# Patient Record
Sex: Female | Born: 1985 | Race: White | Hispanic: No | Marital: Married | Smoking: Never smoker
Health system: Southern US, Community
[De-identification: ages and names within clinical notes are randomized; demographics above are authoritative.]

## PROBLEM LIST (undated history)

## (undated) DIAGNOSIS — D649 Anemia, unspecified: Secondary | ICD-10-CM

## (undated) HISTORY — PX: OTHER SURGICAL HISTORY: SHX169

---

## 2005-10-14 HISTORY — PX: FOOT SURGERY: SHX648

## 2017-02-05 ENCOUNTER — Encounter: Payer: Self-pay | Admitting: Family Medicine

## 2017-02-05 LAB — HM PAP SMEAR: HM PAP: NEGATIVE

## 2017-10-27 DIAGNOSIS — N925 Other specified irregular menstruation: Secondary | ICD-10-CM | POA: Diagnosis not present

## 2017-10-27 DIAGNOSIS — Z113 Encounter for screening for infections with a predominantly sexual mode of transmission: Secondary | ICD-10-CM | POA: Diagnosis not present

## 2017-10-27 DIAGNOSIS — Z6821 Body mass index (BMI) 21.0-21.9, adult: Secondary | ICD-10-CM | POA: Diagnosis not present

## 2017-11-07 DIAGNOSIS — Z3490 Encounter for supervision of normal pregnancy, unspecified, unspecified trimester: Secondary | ICD-10-CM | POA: Insufficient documentation

## 2017-11-11 ENCOUNTER — Encounter: Payer: Self-pay | Admitting: Family Medicine

## 2017-11-11 DIAGNOSIS — Z23 Encounter for immunization: Secondary | ICD-10-CM | POA: Diagnosis not present

## 2017-11-11 DIAGNOSIS — Z3491 Encounter for supervision of normal pregnancy, unspecified, first trimester: Secondary | ICD-10-CM | POA: Diagnosis not present

## 2017-11-24 ENCOUNTER — Encounter: Payer: Self-pay | Admitting: Family Medicine

## 2017-11-24 DIAGNOSIS — Z3480 Encounter for supervision of other normal pregnancy, unspecified trimester: Secondary | ICD-10-CM | POA: Diagnosis not present

## 2017-11-24 DIAGNOSIS — Z3481 Encounter for supervision of other normal pregnancy, first trimester: Secondary | ICD-10-CM | POA: Diagnosis not present

## 2017-11-24 LAB — OB RESULTS CONSOLE RPR: RPR: NONREACTIVE

## 2017-11-24 LAB — OB RESULTS CONSOLE HIV ANTIBODY (ROUTINE TESTING): HIV: NONREACTIVE

## 2017-11-24 LAB — OB RESULTS CONSOLE HEPATITIS B SURFACE ANTIGEN: Hepatitis B Surface Ag: NEGATIVE

## 2017-11-24 LAB — OB RESULTS CONSOLE RUBELLA ANTIBODY, IGM: Rubella: IMMUNE

## 2017-11-24 LAB — OB RESULTS CONSOLE ANTIBODY SCREEN: Antibody Screen: NEGATIVE

## 2017-11-24 LAB — OB RESULTS CONSOLE GC/CHLAMYDIA
Chlamydia: NEGATIVE
GC PROBE AMP, GENITAL: NEGATIVE

## 2017-11-24 LAB — OB RESULTS CONSOLE ABO/RH: RH Type: POSITIVE

## 2017-11-25 LAB — CBC AND DIFFERENTIAL
HCT: 35 — AB (ref 36–46)
HEMOGLOBIN: 11 — AB (ref 12.0–16.0)
Platelets: 300 (ref 150–399)

## 2017-11-30 ENCOUNTER — Encounter: Payer: Self-pay | Admitting: Family Medicine

## 2017-12-12 DIAGNOSIS — B379 Candidiasis, unspecified: Secondary | ICD-10-CM | POA: Diagnosis not present

## 2017-12-12 DIAGNOSIS — B3789 Other sites of candidiasis: Secondary | ICD-10-CM | POA: Diagnosis not present

## 2017-12-13 ENCOUNTER — Encounter (HOSPITAL_COMMUNITY): Payer: Self-pay | Admitting: *Deleted

## 2017-12-13 ENCOUNTER — Ambulatory Visit (HOSPITAL_COMMUNITY)
Admission: EM | Admit: 2017-12-13 | Discharge: 2017-12-13 | Disposition: A | Discharge status: Home / Self Care | Payer: BLUE CROSS/BLUE SHIELD | Attending: Family Medicine | Admitting: Family Medicine

## 2017-12-13 ENCOUNTER — Other Ambulatory Visit: Payer: Self-pay

## 2017-12-13 DIAGNOSIS — B372 Candidiasis of skin and nail: Secondary | ICD-10-CM

## 2017-12-13 MED ORDER — CLOTRIMAZOLE-BETAMETHASONE 1-0.05 % EX CREA: TOPICAL_CREAM | CUTANEOUS | 0 refills | Status: DC

## 2017-12-13 NOTE — ED Triage Notes (Addendum)
Rash under both arms, pelvic area, bottom, per pt it itch, burns, and swollen, started Thursday morning, per pt she wen to a walk in clinic and was prescribed Miconazole and she thinks it made it worse

## 2017-12-18 NOTE — ED Provider Notes (Signed)
  [facility]   [id number] 12/13/17 Arrival Time: 1526  ASSESSMENT & PLAN:  1. Yeast dermatitis     Meds ordered this encounter  Medications  . clotrimazole-betamethasone (LOTRISONE) cream    Sig: Apply to affected area 2 times daily for up to one week.    Dispense:  45 g    Refill:  0    Will f/u here if not seeing improvement over the next several days. Reviewed expectations re: course of current medical issues. Questions answered. Outlined signs and symptoms indicating need for more acute intervention. Patient verbalized understanding. After Visit Summary given.   SUBJECTIVE:  Terri Donaldson is a 32 y.o. female who presents with a skin complaint.   Location: bilateral axilla; inner buttocks Onset: gradual Duration: several days Pruritic? Yes Painful? Occasional discomfort Drainage? No  Known trigger? No  New soaps/lotions/topicals/detergents? No Environmental exposures or allergies? none Contacts with similar? No Recent travel? No  Other associated symptoms: none Therapies tried thus far: Miconazole cream BID; not much relief Denies abdominal pain, arthralgia and fever   ROS: As per HPI.  OBJECTIVE: Vitals:   12/13/17 1549  BP: 127/76  Pulse: 100  Temp: 98.3 F (36.8 C)  TempSrc: Oral  SpO2: 100%    General appearance: alert; no distress Lungs: clear to auscultation bilaterally Heart: regular rate and rhythm Extremities: no edema Skin: warm and dry; bilateral axilla with diffuse confluent erythematous eruption with some underlying erythema; satellite lesions (reports buttock rash is the exact same) Psychological: alert and cooperative; normal mood and affect  No Known Allergies   Social History   Socioeconomic History  . Marital status: Married    Spouse name: Not on file  . Number of children: Not on file  . Years of education: Not on file  . Highest education level: Not on file  Social Needs  . Financial resource strain: Not  on file  . Food insecurity - worry: Not on file  . Food insecurity - inability: Not on file  . Transportation needs - medical: Not on file  . Transportation needs - non-medical: Not on file  Occupational History  . Not on file  Tobacco Use  . Smoking status: Never Smoker  . Smokeless tobacco: Never Used  Substance and Sexual Activity  . Alcohol use: No    Frequency: Never  . Drug use: No  . Sexual activity: Not on file  Other Topics Concern  . Not on file  Social History Narrative  . Not on file    Past Surgical History:  Procedure Laterality Date  . FOOT SURGERY  2007     Mardella LaymanHagler, Dariela Stoker, MD 12/18/17 [phone]

## 2018-01-28 DIAGNOSIS — Z363 Encounter for antenatal screening for malformations: Secondary | ICD-10-CM | POA: Diagnosis not present

## 2018-03-26 DIAGNOSIS — Z3480 Encounter for supervision of other normal pregnancy, unspecified trimester: Secondary | ICD-10-CM | POA: Diagnosis not present

## 2018-03-26 DIAGNOSIS — Z23 Encounter for immunization: Secondary | ICD-10-CM | POA: Diagnosis not present

## 2018-04-02 DIAGNOSIS — O99810 Abnormal glucose complicating pregnancy: Secondary | ICD-10-CM | POA: Diagnosis not present

## 2018-04-29 DIAGNOSIS — Z369 Encounter for antenatal screening, unspecified: Secondary | ICD-10-CM | POA: Diagnosis not present

## 2018-04-29 DIAGNOSIS — R1011 Right upper quadrant pain: Secondary | ICD-10-CM | POA: Diagnosis not present

## 2018-04-29 DIAGNOSIS — O99019 Anemia complicating pregnancy, unspecified trimester: Secondary | ICD-10-CM | POA: Diagnosis not present

## 2018-05-15 DIAGNOSIS — Z369 Encounter for antenatal screening, unspecified: Secondary | ICD-10-CM | POA: Diagnosis not present

## 2018-05-26 ENCOUNTER — Encounter: Payer: Self-pay | Admitting: Family Medicine

## 2018-05-26 DIAGNOSIS — Z369 Encounter for antenatal screening, unspecified: Secondary | ICD-10-CM | POA: Diagnosis not present

## 2018-05-26 DIAGNOSIS — O26843 Uterine size-date discrepancy, third trimester: Secondary | ICD-10-CM | POA: Diagnosis not present

## 2018-05-26 DIAGNOSIS — Z3480 Encounter for supervision of other normal pregnancy, unspecified trimester: Secondary | ICD-10-CM | POA: Diagnosis not present

## 2018-05-26 LAB — OB RESULTS CONSOLE GBS: STREP GROUP B AG: NEGATIVE

## 2018-06-02 DIAGNOSIS — Z369 Encounter for antenatal screening, unspecified: Secondary | ICD-10-CM | POA: Diagnosis not present

## 2018-06-09 DIAGNOSIS — O26849 Uterine size-date discrepancy, unspecified trimester: Secondary | ICD-10-CM | POA: Diagnosis not present

## 2018-06-16 ENCOUNTER — Inpatient Hospital Stay (HOSPITAL_COMMUNITY)
Admission: RE | Admit: 2018-06-16 | Discharge: 2018-06-16 | Disposition: A | Discharge status: Home / Self Care | Payer: BLUE CROSS/BLUE SHIELD | Source: Ambulatory Visit

## 2018-06-16 ENCOUNTER — Other Ambulatory Visit: Payer: Self-pay | Admitting: Obstetrics and Gynecology

## 2018-06-16 ENCOUNTER — Encounter (HOSPITAL_COMMUNITY): Payer: Self-pay

## 2018-06-16 DIAGNOSIS — O403XX1 Polyhydramnios, third trimester, fetus 1: Secondary | ICD-10-CM | POA: Diagnosis not present

## 2018-06-16 DIAGNOSIS — Z369 Encounter for antenatal screening, unspecified: Secondary | ICD-10-CM | POA: Diagnosis not present

## 2018-06-16 HISTORY — DX: Anemia, unspecified: D64.9

## 2018-06-16 NOTE — Patient Instructions (Addendum)
Terri Donaldson  06/16/2018   Your procedure is scheduled on:  06/17/2018  Enter through the Maternity admissions at 0745 AM.    Call this number if you have problems the morning of surgery:[phone]  Remember:   Do not eat food:(After Midnight) Desps de medianoche.  Do not drink clear liquids: (After Midnight) Desps de medianoche.  Take these medicines the morning of surgery with A SIP OF WATER: none   Do not wear jewelry, make-up or nail polish.  Do not wear lotions, powders, or perfumes. Do not wear deodorant.  Do not shave 48 hours prior to surgery.  Do not bring valuables to the hospital.  [facility] is not   responsible for any belongings or valuables brought to the hospital.  Contacts, dentures or bridgework may not be worn into surgery.  Leave suitcase in the car. After surgery it may be brought to your room.  For patients admitted to the hospital, checkout time is 11:00 AM the day of              discharge.    N/A   Please read over the following fact sheets that you were given:   Surgical Site Infection Prevention

## 2018-06-17 ENCOUNTER — Encounter (HOSPITAL_COMMUNITY): Payer: Self-pay | Admitting: *Deleted

## 2018-06-17 ENCOUNTER — Encounter (HOSPITAL_COMMUNITY)
Admission: AD | Disposition: A | Discharge status: Home / Self Care | Payer: Self-pay | Source: Home / Self Care | Attending: Obstetrics and Gynecology

## 2018-06-17 ENCOUNTER — Inpatient Hospital Stay (HOSPITAL_COMMUNITY)
Admission: AD | Admit: 2018-06-17 | Discharge: 2018-06-20 | DRG: 788 | Disposition: A | Discharge status: Home / Self Care | Payer: BLUE CROSS/BLUE SHIELD | Attending: Obstetrics and Gynecology | Admitting: Obstetrics and Gynecology

## 2018-06-17 ENCOUNTER — Inpatient Hospital Stay (HOSPITAL_COMMUNITY): Payer: BLUE CROSS/BLUE SHIELD | Admitting: Certified Registered Nurse Anesthetist

## 2018-06-17 DIAGNOSIS — O403XX1 Polyhydramnios, third trimester, fetus 1: Secondary | ICD-10-CM | POA: Diagnosis not present

## 2018-06-17 DIAGNOSIS — O9902 Anemia complicating childbirth: Secondary | ICD-10-CM | POA: Diagnosis present

## 2018-06-17 DIAGNOSIS — O3663X0 Maternal care for excessive fetal growth, third trimester, not applicable or unspecified: Secondary | ICD-10-CM | POA: Diagnosis not present

## 2018-06-17 DIAGNOSIS — D649 Anemia, unspecified: Secondary | ICD-10-CM | POA: Diagnosis present

## 2018-06-17 DIAGNOSIS — Z3A39 39 weeks gestation of pregnancy: Secondary | ICD-10-CM | POA: Diagnosis not present

## 2018-06-17 DIAGNOSIS — O403XX0 Polyhydramnios, third trimester, not applicable or unspecified: Secondary | ICD-10-CM | POA: Diagnosis present

## 2018-06-17 LAB — CBC
HCT: 36.7 % (ref 36.0–46.0)
HEMOGLOBIN: 12.4 g/dL (ref 12.0–15.0)
MCH: 29.5 pg (ref 26.0–34.0)
MCHC: 33.8 g/dL (ref 30.0–36.0)
MCV: 87.2 fL (ref 78.0–100.0)
Platelets: 134 10*3/uL — ABNORMAL LOW (ref 150–400)
RBC: 4.21 MIL/uL (ref 3.87–5.11)
RDW: 17.3 % — ABNORMAL HIGH (ref 11.5–15.5)
WBC: 8.9 10*3/uL (ref 4.0–10.5)

## 2018-06-17 LAB — PREPARE RBC (CROSSMATCH)

## 2018-06-17 LAB — ABO/RH: ABO/RH(D): A POS

## 2018-06-17 LAB — RPR: RPR Ser Ql: NONREACTIVE

## 2018-06-17 SURGERY — Surgical Case
Anesthesia: Spinal

## 2018-06-17 MED ORDER — DIPHENHYDRAMINE HCL 25 MG PO CAPS: 25.0000 mg | ORAL_CAPSULE | Freq: Four times a day (QID) | ORAL | Status: DC | PRN

## 2018-06-17 MED ORDER — ACETAMINOPHEN 325 MG PO TABS: 650.0000 mg | ORAL_TABLET | ORAL | Status: DC | PRN

## 2018-06-17 MED ORDER — METHYLERGONOVINE MALEATE 0.2 MG PO TABS: 0.2000 mg | ORAL_TABLET | ORAL | Status: DC | PRN

## 2018-06-17 MED ORDER — PHENYLEPHRINE 8 MG IN D5W 100 ML (0.08MG/ML) PREMIX OPTIME
INJECTION | INTRAVENOUS | Status: DC | PRN
  Administered 2018-06-17: 60 ug/min via INTRAVENOUS

## 2018-06-17 MED ORDER — SIMETHICONE 80 MG PO CHEW: 80.0000 mg | CHEWABLE_TABLET | ORAL | Status: DC | PRN

## 2018-06-17 MED ORDER — PRENATAL MULTIVITAMIN CH
1.0000 | ORAL_TABLET | Freq: Every day | ORAL | Status: DC
  Filled 2018-06-17: qty 1

## 2018-06-17 MED ORDER — MENTHOL 3 MG MT LOZG: 1.0000 | LOZENGE | OROMUCOSAL | Status: DC | PRN

## 2018-06-17 MED ORDER — DEXAMETHASONE SODIUM PHOSPHATE 4 MG/ML IJ SOLN
INTRAMUSCULAR | Status: AC
  Filled 2018-06-17: qty 1

## 2018-06-17 MED ORDER — ONDANSETRON HCL 4 MG/2ML IJ SOLN
INTRAMUSCULAR | Status: AC
  Filled 2018-06-17: qty 2

## 2018-06-17 MED ORDER — OXYTOCIN 40 UNITS IN LACTATED RINGERS INFUSION - SIMPLE MED: 2.5000 [IU]/h | INTRAVENOUS | Status: AC

## 2018-06-17 MED ORDER — PHENYLEPHRINE 40 MCG/ML (10ML) SYRINGE FOR IV PUSH (FOR BLOOD PRESSURE SUPPORT)
PREFILLED_SYRINGE | INTRAVENOUS | Status: AC
  Filled 2018-06-17: qty 10

## 2018-06-17 MED ORDER — KETOROLAC TROMETHAMINE 30 MG/ML IJ SOLN: 30.0000 mg | Freq: Once | INTRAMUSCULAR | Status: DC | PRN

## 2018-06-17 MED ORDER — OXYCODONE HCL 5 MG PO TABS: 5.0000 mg | ORAL_TABLET | Freq: Once | ORAL | Status: DC | PRN

## 2018-06-17 MED ORDER — CEFAZOLIN SODIUM-DEXTROSE 2-4 GM/100ML-% IV SOLN
2.0000 g | Freq: Once | INTRAVENOUS | Status: AC
  Administered 2018-06-17: 2 g via INTRAVENOUS

## 2018-06-17 MED ORDER — MORPHINE SULFATE (PF) 0.5 MG/ML IJ SOLN
INTRAMUSCULAR | Status: AC
  Filled 2018-06-17: qty 10

## 2018-06-17 MED ORDER — SENNOSIDES-DOCUSATE SODIUM 8.6-50 MG PO TABS
2.0000 | ORAL_TABLET | ORAL | Status: DC
  Administered 2018-06-17 – 2018-06-19 (×3): 2 via ORAL
  Filled 2018-06-17 (×3): qty 2

## 2018-06-17 MED ORDER — SIMETHICONE 80 MG PO CHEW
80.0000 mg | CHEWABLE_TABLET | ORAL | Status: DC
  Administered 2018-06-17 – 2018-06-19 (×3): 80 mg via ORAL
  Filled 2018-06-17 (×3): qty 1

## 2018-06-17 MED ORDER — ONDANSETRON HCL 4 MG/2ML IJ SOLN
INTRAMUSCULAR | Status: DC | PRN
  Administered 2018-06-17: 4 mg via INTRAVENOUS

## 2018-06-17 MED ORDER — SODIUM CHLORIDE 0.9 % IR SOLN
Status: DC | PRN
  Administered 2018-06-17: 1

## 2018-06-17 MED ORDER — LACTATED RINGERS IV SOLN
INTRAVENOUS | Status: DC
  Administered 2018-06-17: 22:00:00 via INTRAVENOUS

## 2018-06-17 MED ORDER — FENTANYL CITRATE (PF) 100 MCG/2ML IJ SOLN
INTRAMUSCULAR | Status: AC
  Filled 2018-06-17: qty 2

## 2018-06-17 MED ORDER — TETANUS-DIPHTH-ACELL PERTUSSIS 5-2.5-18.5 LF-MCG/0.5 IM SUSP: 0.5000 mL | Freq: Once | INTRAMUSCULAR | Status: DC

## 2018-06-17 MED ORDER — WITCH HAZEL-GLYCERIN EX PADS: 1.0000 "application " | MEDICATED_PAD | CUTANEOUS | Status: DC | PRN

## 2018-06-17 MED ORDER — FENTANYL CITRATE (PF) 100 MCG/2ML IJ SOLN
INTRAMUSCULAR | Status: DC | PRN
  Administered 2018-06-17: 15 ug via INTRATHECAL

## 2018-06-17 MED ORDER — HYDROMORPHONE HCL 1 MG/ML IJ SOLN: 0.2500 mg | INTRAMUSCULAR | Status: DC | PRN

## 2018-06-17 MED ORDER — LACTATED RINGERS IV SOLN
INTRAVENOUS | Status: DC | PRN
  Administered 2018-06-17: 10:00:00 via INTRAVENOUS

## 2018-06-17 MED ORDER — BUPIVACAINE IN DEXTROSE 0.75-8.25 % IT SOLN
INTRATHECAL | Status: DC | PRN
  Administered 2018-06-17: 1.6 mL via INTRATHECAL

## 2018-06-17 MED ORDER — PHENYLEPHRINE 8 MG IN D5W 100 ML (0.08MG/ML) PREMIX OPTIME
INJECTION | INTRAVENOUS | Status: AC
  Filled 2018-06-17: qty 100

## 2018-06-17 MED ORDER — OXYCODONE HCL 5 MG/5ML PO SOLN: 5.0000 mg | Freq: Once | ORAL | Status: DC | PRN

## 2018-06-17 MED ORDER — METHYLERGONOVINE MALEATE 0.2 MG/ML IJ SOLN: 0.2000 mg | INTRAMUSCULAR | Status: DC | PRN

## 2018-06-17 MED ORDER — IBUPROFEN 600 MG PO TABS
600.0000 mg | ORAL_TABLET | Freq: Four times a day (QID) | ORAL | Status: DC
  Administered 2018-06-17 – 2018-06-20 (×12): 600 mg via ORAL
  Filled 2018-06-17 (×13): qty 1

## 2018-06-17 MED ORDER — OXYCODONE-ACETAMINOPHEN 5-325 MG PO TABS: 1.0000 | ORAL_TABLET | ORAL | Status: DC | PRN

## 2018-06-17 MED ORDER — ZOLPIDEM TARTRATE 5 MG PO TABS: 5.0000 mg | ORAL_TABLET | Freq: Every evening | ORAL | Status: DC | PRN

## 2018-06-17 MED ORDER — MORPHINE SULFATE (PF) 0.5 MG/ML IJ SOLN
INTRAMUSCULAR | Status: DC | PRN
  Administered 2018-06-17: .15 mg via INTRATHECAL

## 2018-06-17 MED ORDER — OXYTOCIN 10 UNIT/ML IJ SOLN
INTRAVENOUS | Status: DC | PRN
  Administered 2018-06-17: 40 [IU] via INTRAVENOUS

## 2018-06-17 MED ORDER — PROMETHAZINE HCL 25 MG/ML IJ SOLN: 6.2500 mg | INTRAMUSCULAR | Status: DC | PRN

## 2018-06-17 MED ORDER — OXYCODONE-ACETAMINOPHEN 5-325 MG PO TABS: 2.0000 | ORAL_TABLET | ORAL | Status: DC | PRN

## 2018-06-17 MED ORDER — COCONUT OIL OIL
1.0000 "application " | TOPICAL_OIL | Status: DC | PRN
  Administered 2018-06-18: 1 via TOPICAL
  Filled 2018-06-17: qty 120

## 2018-06-17 MED ORDER — DIBUCAINE 1 % RE OINT: 1.0000 "application " | TOPICAL_OINTMENT | RECTAL | Status: DC | PRN

## 2018-06-17 MED ORDER — SODIUM CHLORIDE 0.9% IV SOLUTION: Freq: Once | INTRAVENOUS | Status: DC

## 2018-06-17 MED ORDER — OXYTOCIN 10 UNIT/ML IJ SOLN
INTRAMUSCULAR | Status: AC
  Filled 2018-06-17: qty 4

## 2018-06-17 MED ORDER — SIMETHICONE 80 MG PO CHEW
80.0000 mg | CHEWABLE_TABLET | Freq: Three times a day (TID) | ORAL | Status: DC
  Administered 2018-06-17 – 2018-06-20 (×7): 80 mg via ORAL
  Filled 2018-06-17 (×8): qty 1

## 2018-06-17 MED ORDER — DEXAMETHASONE SODIUM PHOSPHATE 4 MG/ML IJ SOLN
INTRAMUSCULAR | Status: DC | PRN
  Administered 2018-06-17: 4 mg via INTRAVENOUS

## 2018-06-17 MED ORDER — LACTATED RINGERS IV SOLN
INTRAVENOUS | Status: DC
  Administered 2018-06-17 (×2): via INTRAVENOUS

## 2018-06-17 SURGICAL SUPPLY — 30 items
CHLORAPREP W/TINT 26ML (MISCELLANEOUS) ×2 IMPLANT
CLAMP CORD UMBIL (MISCELLANEOUS) ×2 IMPLANT
CLOTH BEACON ORANGE TIMEOUT ST (SAFETY) ×2 IMPLANT
DERMABOND ADVANCED (GAUZE/BANDAGES/DRESSINGS) ×1
DERMABOND ADVANCED .7 DNX12 (GAUZE/BANDAGES/DRESSINGS) ×1 IMPLANT
DRSG OPSITE POSTOP 4X10 (GAUZE/BANDAGES/DRESSINGS) ×2 IMPLANT
ELECT REM PT RETURN 9FT ADLT (ELECTROSURGICAL) ×2
ELECTRODE REM PT RTRN 9FT ADLT (ELECTROSURGICAL) ×1 IMPLANT
EXTRACTOR VACUUM M CUP 4 TUBE (SUCTIONS) IMPLANT
GLOVE BIO SURGEON STRL SZ7 (GLOVE) ×2 IMPLANT
GLOVE BIOGEL PI IND STRL 7.0 (GLOVE) ×1 IMPLANT
GLOVE BIOGEL PI INDICATOR 7.0 (GLOVE) ×1
GOWN STRL REUS W/TWL LRG LVL3 (GOWN DISPOSABLE) ×4 IMPLANT
KIT ABG SYR 3ML LUER SLIP (SYRINGE) IMPLANT
NEEDLE HYPO 22GX1.5 SAFETY (NEEDLE) IMPLANT
NEEDLE HYPO 25X5/8 SAFETYGLIDE (NEEDLE) IMPLANT
NS IRRIG 1000ML POUR BTL (IV SOLUTION) ×2 IMPLANT
PACK C SECTION WH (CUSTOM PROCEDURE TRAY) ×2 IMPLANT
PAD OB MATERNITY 4.3X12.25 (PERSONAL CARE ITEMS) ×2 IMPLANT
PENCIL SMOKE EVAC W/HOLSTER (ELECTROSURGICAL) ×2 IMPLANT
RTRCTR C-SECT PINK 25CM LRG (MISCELLANEOUS) ×2 IMPLANT
SUT CHROMIC 1 CTX 36 (SUTURE) ×4 IMPLANT
SUT CHROMIC 2 0 CT 1 (SUTURE) ×2 IMPLANT
SUT PDS AB 0 CTX 60 (SUTURE) ×2 IMPLANT
SUT VIC AB 2-0 CT1 27 (SUTURE) ×1
SUT VIC AB 2-0 CT1 TAPERPNT 27 (SUTURE) ×1 IMPLANT
SUT VIC AB 4-0 KS 27 (SUTURE) IMPLANT
SYR 30ML LL (SYRINGE) IMPLANT
TOWEL OR 17X24 6PK STRL BLUE (TOWEL DISPOSABLE) ×2 IMPLANT
TRAY FOLEY W/BAG SLVR 14FR LF (SET/KITS/TRAYS/PACK) ×2 IMPLANT

## 2018-06-17 NOTE — Anesthesia Postprocedure Evaluation (Signed)
Anesthesia Post Note  Patient: Terri Donaldson  Procedure(s) Performed: CESAREAN SECTION (N/A )     Patient location during evaluation: Mother Baby Anesthesia Type: Spinal Level of consciousness: awake and alert Pain management: pain level controlled Vital Signs Assessment: post-procedure vital signs reviewed and stable Respiratory status: spontaneous breathing, nonlabored ventilation and respiratory function stable Cardiovascular status: stable Postop Assessment: no headache, no backache, spinal receding, patient able to bend at knees, no apparent nausea or vomiting, able to ambulate and adequate PO intake Anesthetic complications: no    Last Vitals:  Vitals:   06/17/18 1240 06/17/18 1345  BP: 136/88 136/77  Pulse: 60 70  Resp: 18 17  Temp: 36.7 C 36.9 C  SpO2: 97% 96%    Last Pain:  Vitals:   06/17/18 1345  TempSrc: Oral  PainSc: 0-No pain   Pain Goal: Patients Stated Pain Goal: 4 (06/17/18 0805)               Laban Emperor

## 2018-06-17 NOTE — H&P (Signed)
Terri Donaldson is a 32 y.o. female presenting for primary cesarean section for suspected macrosomia  32 yo G1P0 @ 39+0 presents for primary cesarean section for suspected macrosomia.Her pregnancy has been complicated by anemia. EFW at 37 weeks was 4300 gm with polyhydramnios with AFI of 35. Pt offered primary c/s vs TOL. Pt elected primary c/s OB History    Gravida  1   Para      Term      Preterm      AB      Living        SAB      TAB      Ectopic      Multiple      Live Births             Past Medical History:  Diagnosis Date  . Anemia    Past Surgical History:  Procedure Laterality Date  . FOOT SURGERY  2007  . foot surgery     Family History: family history includes Hyperlipidemia in her father; Hypertension in her father and maternal grandfather. Social History:  reports that she has never smoked. She has never used smokeless tobacco. She reports that she does not drink alcohol or use drugs.     Maternal Diabetes: No Genetic Screening: Normal Maternal Ultrasounds/Referrals: Normal Fetal Ultrasounds or other Referrals:  None Maternal Substance Abuse:  No Significant Maternal Medications:  None Significant Maternal Lab Results:  None Other Comments:  None  ROS History   Blood pressure 120/81, pulse 85, temperature (!) 97.5 F (36.4 C), temperature source Oral, resp. rate 18, height 5\' 5"  (1.651 m), weight 76.8 kg. Exam Physical Exam  Prenatal labs: ABO, Rh: A/Positive/-- (02/11 0000) Antibody: Negative (02/11 0000) Rubella: Immune (02/11 0000) RPR: Nonreactive (02/11 0000)  HBsAg: Negative (02/11 0000)  HIV: Non-reactive (02/11 0000)  GBS: Negative (08/13 0000)   Assessment/Plan: 1) Admit 2) SCDs 3) Type & cross 4) Ancef OCTOR   Waynard Reeds 06/17/2018, 8:38 AM

## 2018-06-17 NOTE — Anesthesia Preprocedure Evaluation (Signed)
Anesthesia Evaluation  Patient identified by MRN, date of birth, ID band Patient awake    Reviewed: Allergy & Precautions, NPO status , Patient's Chart, lab work & pertinent test results  Airway Mallampati: II  TM Distance: >3 FB Neck ROM: Full    Dental no notable dental hx.    Pulmonary neg pulmonary ROS,    Pulmonary exam normal breath sounds clear to auscultation       Cardiovascular negative cardio ROS Normal cardiovascular exam Rhythm:Regular Rate:Normal     Neuro/Psych negative neurological ROS  negative psych ROS   GI/Hepatic negative GI ROS, Neg liver ROS,   Endo/Other  negative endocrine ROS  Renal/GU negative Renal ROS  negative genitourinary   Musculoskeletal negative musculoskeletal ROS (+)   Abdominal   Peds negative pediatric ROS (+)  Hematology negative hematology ROS (+)   Anesthesia Other Findings   Reproductive/Obstetrics negative OB ROS (+) Pregnancy                             Anesthesia Physical Anesthesia Plan  ASA: II  Anesthesia Plan: Spinal   Post-op Pain Management:    Induction:   PONV Risk Score and Plan: 2 and Treatment may vary due to age or medical condition  Airway Management Planned: Natural Airway  Additional Equipment:   Intra-op Plan:   Post-operative Plan:   Informed Consent: I have reviewed the patients History and Physical, chart, labs and discussed the procedure including the risks, benefits and alternatives for the proposed anesthesia with the patient or authorized representative who has indicated his/her understanding and acceptance.   Dental advisory given  Plan Discussed with: CRNA  Anesthesia Plan Comments:         Anesthesia Quick Evaluation  

## 2018-06-17 NOTE — Anesthesia Procedure Notes (Signed)
Spinal  Patient location during procedure: OB Start time: 06/17/2018 9:34 AM End time: 06/17/2018 9:39 AM Staffing Anesthesiologist: Lowella Curb, MD Performed: anesthesiologist  Preanesthetic Checklist Completed: patient identified, surgical consent, pre-op evaluation, timeout performed, IV checked, risks and benefits discussed and monitors and equipment checked Spinal Block Patient position: sitting Prep: site prepped and draped and DuraPrep Patient monitoring: heart rate, cardiac monitor, continuous pulse ox and blood pressure Approach: midline Location: L3-4 Injection technique: single-shot Needle Needle type: Pencan  Needle gauge: 24 G Needle length: 10 cm Assessment Sensory level: T4

## 2018-06-17 NOTE — Lactation Note (Signed)
This note was copied from a baby's chart. Lactation Consultation Note  Patient Name: Terri Donaldson Date: 06/17/2018 Reason for consult: Initial assessment;Primapara;1st time breastfeeding;Term  9 hours old FT female who is being exclusively BF by his mother, she's a P1. Mom took BF classes here at [facility] and she already knows how to hand express. When reviewing hand expression with mom, noted some big drops of colostrum flowing easily out of both breast. Showed parents how to finger feed baby. Mom has a Spectra S1 DEBP at home.  Noticed that mom has small scabs on both nipples, reviewed treatment for sore nipples. After dad changed baby's diaper, LC took baby STS to mom's left breast in cross cradle position and he was able to latch on with very little difficulty. Had so repositioned a couple of times when the latch became shallow, showed mom how to break the latch.  She's been feeding baby on cross cradle position only. Suggested to try to football hold on the next feeding. Gave mom comfort gels, instructions, cleaning and storage were reviewed. Both parents were very receptive to learning and did everything LC suggested, both are first time parents.  Encouraged mom to feed baby STS 8-12 times/24 hours or sooner if feeding cues are present. Discussed cluster feeding and baby's sleeping cycle. Reviewed BF brochure, BF resources and feeding diary, both parents are aware of LC services and will call PRN.  Maternal Data Formula Feeding for Exclusion: No Has patient been taught Hand Expression?: Yes Does the patient have breastfeeding experience prior to this delivery?: No  Feeding Feeding Type: Breast Fed Length of feed: 14 min  LATCH Score Latch: Grasps breast easily, tongue down, lips flanged, rhythmical sucking.  Audible Swallowing: A few with stimulation  Type of Nipple: Everted at rest and after stimulation  Comfort (Breast/Nipple): Filling, red/small blisters or bruises,  mild/mod discomfort  Hold (Positioning): Assistance needed to correctly position infant at breast and maintain latch.  LATCH Score: 7  Interventions Interventions: Breast feeding basics reviewed;Assisted with latch;Skin to skin;Breast massage;Hand express;Breast compression;Adjust position;Position options;Support pillows  Lactation Tools Discussed/Used Tools: Comfort gels WIC Program: No   Consult Status Consult Status: Follow-up Date: 06/18/18 Follow-up type: In-patient    Aaleigha Bozza Venetia Constable 06/17/2018, 7:32 PM

## 2018-06-17 NOTE — Anesthesia Postprocedure Evaluation (Signed)
Anesthesia Post Note  Patient: Terri Donaldson  Procedure(s) Performed: CESAREAN SECTION (N/A )     Patient location during evaluation: PACU Anesthesia Type: Spinal Level of consciousness: oriented and awake and alert Pain management: pain level controlled Vital Signs Assessment: post-procedure vital signs reviewed and stable Respiratory status: spontaneous breathing and respiratory function stable Cardiovascular status: blood pressure returned to baseline and stable Postop Assessment: no headache, no backache and no apparent nausea or vomiting Anesthetic complications: no    Last Vitals:  Vitals:   06/17/18 1215 06/17/18 1240  BP: 123/66 136/88  Pulse: 65 60  Resp: 20 18  Temp:  36.7 C  SpO2: 98% 97%    Last Pain:  Vitals:   06/17/18 1240  TempSrc: Oral  PainSc: 0-No pain   Pain Goal: Patients Stated Pain Goal: 4 (06/17/18 0805)               Lowella Curb

## 2018-06-17 NOTE — Transfer of Care (Signed)
Immediate Anesthesia Transfer of Care Note  Patient: Terri Donaldson  Procedure(s) Performed: CESAREAN SECTION (N/A )  Patient Location: PACU  Anesthesia Type:Spinal  Level of Consciousness: awake, alert  and oriented  Airway & Oxygen Therapy: Patient Spontanous Breathing and Patient connected to nasal cannula oxygen  Post-op Assessment: Report given to RN and Post -op Vital signs reviewed and stable  Post vital signs: Reviewed and stable  Last Vitals:  Vitals Value Taken Time  BP    Temp    Pulse    Resp    SpO2      Last Pain:  Vitals:   06/17/18 0810  TempSrc: Oral  PainSc:       Patients Stated Pain Goal: 4 (06/17/18 0805)  Complications: No apparent anesthesia complications

## 2018-06-17 NOTE — Addendum Note (Signed)
Addendum  created 06/17/18 1607 by Elgie Congo, CRNA   Sign clinical note

## 2018-06-17 NOTE — Op Note (Signed)
Pre-Operative Diagnosis: 1) 39+0-week intrauterine pregnancy 2) suspected macrosomia 3) polyhydramnios Postoperative Diagnosis:  1) 39+0-week intrauterine pregnancy 2) suspected macrosomia 3) polyhydramnios Procedure: Primary low transverse cesarean section Surgeon: Dr. Waynard Reeds Assistant: None Operative Findings: Vigorous female infant in the vertex presentation with Apgar scores of 9 at 1 minute and 9 at 5 minutes.  Normal-appearing ovaries, tubes, uterus.  Infant weight was not available at the time of this dictation Specimen: Placenta for disposal EBL: Total I/O In: 2300 [I.V.:2300] Out: 1139 [Urine:150; Blood:989]   Procedure:Terri Donaldson is an 32 year old gravida 1 para 0 at 73 weeks and 0 days estimated gestational age who presents for cesarean section.  Estimated fetal weight 2 weeks prior to admission was 4300 g.  Patient was offered a trial of labor versus a primary cesarean section and she elected to proceed with primary cesarean section.  Following the appropriate informed consent the patient was brought to the operating room where spinal anesthesia was administered and found to be adequate. She was placed in the dorsal supine position with a leftward tilt. She was prepped and draped in the normal sterile fashion.  The patient was appropriately identified during a preoperative timeout procedure scalpel was then used to make a Pfannenstiel skin incision which was carried down to the underlying layers of soft tissue to the fascia. The fascia was incised in the midline and the fascial incision was extended laterally with Mayo scissors. The superior aspect of the fascial incision was grasped with Coker clamps x2, tented up and the rectus muscles dissected off sharply with the electrocautery unit area and the same procedure was repeated on the inferior aspect of the fascial incision. The rectus muscles were separated in the midline. The abdominal peritoneum was identified, tented up, entered  sharply, and the incision was extended superiorly and inferiorly with good visualization of the bladder. The Alexis retractor was then deployed. The vesicouterine peritoneum was identified, tented up, entered sharply, and the bladder flap was created digitally. Scalpel was then used to make a low transverse incision on the uterus which was extended laterally with blunt dissection . The fetal vertex was identified, delivered easily through the uterine incision followed by the body. The infant was bulb suctioned on the operative field cried vigorously.  After a 1 minute delay the cord was clamped and cut and the infant was passed to the waiting neonatology team. Placenta was then delivered spontaneously, the uterus was cleared of all clot and debris. The uterine incision was repaired with #1 chromic in running locked fashion followed by a second imbricating layer. Ovaries and tubes were inspected and normal. The Alexis retractor was removed. . The abdominal peritoneum was reapproximated with 2-0 Vicryl in a running fashion, the rectus muscles was reapproximated with 2-0 chromic in a running fashion. The fascia was closed with a looped PDS in a running fashion. The skin was closed with 4-0 vicryl in a subcuticular fashion and Dermabond. All sponge lap and needle counts were correct. Patient tolerated the procedure well and recovered in stable condition following the procedure.

## 2018-06-18 LAB — CBC
HCT: 26.1 % — ABNORMAL LOW (ref 36.0–46.0)
Hemoglobin: 8.8 g/dL — ABNORMAL LOW (ref 12.0–15.0)
MCH: 29.6 pg (ref 26.0–34.0)
MCHC: 33.7 g/dL (ref 30.0–36.0)
MCV: 87.9 fL (ref 78.0–100.0)
PLATELETS: 92 10*3/uL — AB (ref 150–400)
RBC: 2.97 MIL/uL — ABNORMAL LOW (ref 3.87–5.11)
RDW: 17.1 % — ABNORMAL HIGH (ref 11.5–15.5)
WBC: 9.6 10*3/uL (ref 4.0–10.5)

## 2018-06-18 LAB — BIRTH TISSUE RECOVERY COLLECTION (PLACENTA DONATION)

## 2018-06-18 MED ORDER — COMPLETENATE 29-1 MG PO CHEW
1.0000 | CHEWABLE_TABLET | Freq: Every day | ORAL | Status: DC
  Administered 2018-06-18 – 2018-06-20 (×3): 1 via ORAL
  Filled 2018-06-18 (×4): qty 1

## 2018-06-18 NOTE — Progress Notes (Addendum)
  Patient is eating, ambulating, voiding.  Pain control is good.  Vitals:   06/17/18 1600 06/17/18 2008 06/18/18 0025 06/18/18 0500  BP:  126/76 111/63 104/64  Pulse:  65 74 70  Resp:  18 16 14   Temp: 98.4 F (36.9 C) 98.7 F (37.1 C) 98.5 F (36.9 C) 98.3 F (36.8 C)  TempSrc: Oral Oral Oral Oral  SpO2: 97% 95% 96% 98%  Weight:      Height:        lungs:   clear to auscultation cor:    RRR Abdomen:  soft, appropriate tenderness, incisions intact and without erythema or exudate ex:    no cords   Lab Results  Component Value Date   WBC 9.6 06/18/2018   HGB 8.8 (L) 06/18/2018   HCT 26.1 (L) 06/18/2018   MCV 87.9 06/18/2018   PLT 92 (L) 06/18/2018    --/--/A POS (09/04 0819)/RI  A/P    Post operative day 1.  Routine post op and postpartum care.  Expect d/c tomorrow.  Percocet for pain control. Iron.  Parents do not want circumcision.

## 2018-06-19 NOTE — Lactation Note (Signed)
This note was copied from a baby's chart. Lactation Consultation Note  Patient Name: Terri Donaldson Date: 06/19/2018 Reason for consult: Follow-up assessment;Other (Comment)   RN called out for [facility] assistance.  Mom has nipple pain of 7 on left side and 2-3 on right side.  Both nipples have bruising and redness; compression stripes on both.  Mom demonstrates hand expression and latches infant deeply using cross cradle.  Swallows heard with massage and compression.  Infant then switched breasts and latched deeply, sustained latched but mom complained of pain.  When infant came off nipple was pinched. Infant lingual frenulum visualized easily.   LC provided mom with NS#24.  Mom placed nipple shield on and was taught to prefill the shield with EBM.  Curved tip used for this.  Mom states pain went from 7 to 1 or 2 with NS.   Mom encouraged to hand express prior to and after every feed.    Discussed DEBP 4-6 times a day after bf. with NS use.  Discussed plan with MBU RN.    LC also encouraged mom to see OP lactation if soreness continues after going home.   Maternal Data    Feeding Feeding Type: Breast Fed Length of feed: 25 min  LATCH Score Latch: Grasps breast easily, tongue down, lips flanged, rhythmical sucking.  Audible Swallowing: A few with stimulation  Type of Nipple: Everted at rest and after stimulation  Comfort (Breast/Nipple): Filling, red/small blisters or bruises, mild/mod discomfort(left nipple more sore/pain 7, compression bruising)  Hold (Positioning): Assistance needed to correctly position infant at breast and maintain latch.  LATCH Score: 7  Interventions Interventions: Breast feeding basics reviewed;Assisted with latch;Skin to skin;Breast massage;Hand express;Pre-pump if needed;Breast compression;Adjust position;DEBP;Hand pump;Expressed milk;Position options;Support pillows  Lactation Tools Discussed/Used Tools: Pump;Nipple Shields Nipple shield size:  24 Breast pump type: Double-Electric Breast Pump;Manual Date initiated:: 06/19/18   Consult Status Consult Status: Follow-up Date: 06/20/18 Follow-up type: In-patient    Terri Donaldson [facility] 06/19/2018, 11:00 AM

## 2018-06-19 NOTE — Progress Notes (Signed)
POD#2 Pt without complaints. Would like to wait on discharge until tomorrow. Lochia wnl VSSAF IMP/ Stable Plan/ Routine care

## 2018-06-19 NOTE — Lactation Note (Signed)
This note was copied from a baby's chart. Lactation Consultation Note  Patient Name: Terri Donaldson [id number] Date: 06/19/2018 Reason for consult: Mother's request  Mom reports he is fussy at the breast sometimes when he latches/esp on the left breast.  Infant bf on arrival with 24 mm nipple shield/infant sleepy. Parents report he has been feeding a while.  Urged mom to hand express prior to using nipple shield and hold breast while using nipple shield . when he is fussy/comes off and on can make it move.  Put some milk on outside of nipple shield so he can immediately taste it. Urged mom to call for assistance as needed,  Mom had wanted assistance earlier.  RN assisted her. Maternal Data    Feeding Feeding Type: Breast Fed Length of feed: 30 min(changing back and forth)  LATCH Score Latch: Grasps breast easily, tongue down, lips flanged, rhythmical sucking.  Audible Swallowing: Spontaneous and intermittent  Type of Nipple: Everted at rest and after stimulation  Comfort (Breast/Nipple): Filling, red/small blisters or bruises, mild/mod discomfort  Hold (Positioning): Assistance needed to correctly position infant at breast and maintain latch.  LATCH Score: 8  Interventions    Lactation Tools Discussed/Used     Consult Status Consult Status: Follow-up Date: 06/20/18 Follow-up type: In-patient    [name] 06/19/2018, 5:46 PM

## 2018-06-20 MED ORDER — IBUPROFEN 600 MG PO TABS: 600.0000 mg | ORAL_TABLET | Freq: Four times a day (QID) | ORAL | 0 refills | Status: DC

## 2018-06-20 MED ORDER — OXYCODONE-ACETAMINOPHEN 5-325 MG PO TABS: 1.0000 | ORAL_TABLET | ORAL | 0 refills | Status: DC | PRN

## 2018-06-20 NOTE — Lactation Note (Signed)
This note was copied from a baby's chart. Lactation Consultation Note  Patient Name: Terri Donaldson [id number] Date: 06/20/2018 Reason for consult: Follow-up assessment;Infant weight loss  Visited with P1 Mom of 71 hr old baby.  Baby at 10.6% weight loss.  Mom breast feeding baby frequently as baby is fussy shortly after he comes off the breast.   Mom had nipple trauma since first latch.  Provided with a nipple shield (24 mm) yesterday.  Mom last pumped last evening at 7pm.   Output- 1 void, 0 stool last 24 hrs.    Assisted Mom to double pump, obtained 4 ml of colostrum  Observed baby on the breast in cross cradle hold using a 24 mm nipple shield.  Baby appears to have a deep latch, no swallowing identified while baby sucking vigorously.  Took nipple shield off and latched baby using SNS with 4 ml colostrum.  Nipple pinched and abraded.  And baby unable to create suction to pull supplement from SNS at breast or on FOB's finger. Only 2 ml taken from SNS  Oral assessment- normal arched palate, short posterior lingual frenulum noted.  Baby unable to create a seal on finger as tongue rocking up and down against finger.    Taught FOB how to pace bottle feed.    Plan- 1- Awaken baby at least every 3 hrs for feeding. 2- Keep baby STS at breast and feed baby using 20 mm nipple shield. 3- Offer baby supplement of EBM+/formula per volume guidelines on handout, increasing each day. 4- Pump both breasts 15-20 mins, using breast massage and hand expression. 5- Follow-up with Lactation, request sent to Clinic. Call prn for concerns.  Parents aware of importance of lactation follow-up, and assessing baby's ability to transfer milk from the breast.  Engorgement prevention and treatment reviewed. Mom aware of OP lactation services.  Encouraged to call prn.  Baby may stay as a Baby Pt., recommended a weight later today prior to possible discharge.  Parents are good with plan  currently.  Interventions Interventions: Breast feeding basics reviewed;Assisted with latch;Skin to skin;Breast massage;Hand express;Breast compression;Adjust position;Support pillows;Position options;Expressed milk;Coconut oil;Comfort gels;DEBP  Lactation Tools Discussed/Used Tools: Pump;Supplemental Nutrition System;Nipple Shields Nipple shield size: 20 Breast pump type: Double-Electric Breast Pump   Consult Status Consult Status: Follow-up Date: 06/23/18 Follow-up type: Out-patient    Judee Clara 06/20/2018, 9:33 AM

## 2018-06-20 NOTE — Progress Notes (Signed)
POD#3 Pt is having difficulty with breastfeeding. She is working with lactation. No post op c/o VSSAF IMP/ Stable Plan/ Will d/c, Baby may have to stay

## 2018-06-20 NOTE — Discharge Summary (Signed)
Obstetric Discharge Summary Reason for Admission: cesarean section Prenatal Procedures: NST and ultrasound Intrapartum Procedures: cesarean: low cervical, transverse Postpartum Procedures: none Complications-Operative and Postpartum: none Hemoglobin  Date Value Ref Range Status  06/18/2018 8.8 (L) 12.0 - 15.0 g/dL Final    Comment:    DELTA CHECK NOTED REPEATED TO VERIFY    HCT  Date Value Ref Range Status  06/18/2018 26.1 (L) 36.0 - 46.0 % Final    Physical Exam:  General: alert and cooperative Lochia: appropriate Uterine Fundus: firm Incision: healing well DVT Evaluation: No evidence of DVT seen on physical exam.  Discharge Diagnoses: Term Pregnancy-delivered and macrosomia and polyhydramnios  Discharge Information: Date: 06/20/2018 Activity: pelvic rest Diet: routine Medications: PNV, Ibuprofen, Iron and Percocet Condition: stable Instructions: refer to practice specific booklet Discharge to: home Follow-up Information    Waynard Reeds, MD. Schedule an appointment as soon as possible for a visit in 1 month(s).   Specialty:  Obstetrics and Gynecology Contact information: [address] [place] [address] [phone]           Newborn Data: Live born female  Birth Weight: 9 lb 14.4 oz (4490 g) APGAR: 9, 9  Newborn Delivery   Birth date/time:  06/17/2018 10:00:00 Delivery type:  C-Section, Low Transverse Trial of labor:  No C-section categorization:  Primary     Home with not known at this time.  ANDERSON,MARK E 06/20/2018, 9:15 AM

## 2018-06-21 LAB — BPAM RBC
BLOOD PRODUCT EXPIRATION DATE: 201910032359
BLOOD PRODUCT EXPIRATION DATE: 201910032359
UNIT TYPE AND RH: 6200
Unit Type and Rh: 6200

## 2018-06-21 LAB — TYPE AND SCREEN
ABO/RH(D): A POS
Antibody Screen: NEGATIVE
UNIT DIVISION: 0
UNIT DIVISION: 0

## 2018-06-27 DIAGNOSIS — Z23 Encounter for immunization: Secondary | ICD-10-CM | POA: Diagnosis not present

## 2018-07-16 ENCOUNTER — Ambulatory Visit: Payer: BLUE CROSS/BLUE SHIELD | Admitting: Family Medicine

## 2018-07-16 ENCOUNTER — Encounter: Payer: Self-pay | Admitting: Family Medicine

## 2018-07-16 VITALS — BP 120/72 | HR 83 | Temp 98.4°F | Ht 64.0 in | Wt 126.6 lb

## 2018-07-16 DIAGNOSIS — Z6820 Body mass index (BMI) 20.0-20.9, adult: Secondary | ICD-10-CM | POA: Diagnosis not present

## 2018-07-16 DIAGNOSIS — B353 Tinea pedis: Secondary | ICD-10-CM

## 2018-07-16 DIAGNOSIS — Z124 Encounter for screening for malignant neoplasm of cervix: Secondary | ICD-10-CM | POA: Diagnosis not present

## 2018-07-16 DIAGNOSIS — B351 Tinea unguium: Secondary | ICD-10-CM | POA: Diagnosis not present

## 2018-07-16 DIAGNOSIS — J302 Other seasonal allergic rhinitis: Secondary | ICD-10-CM

## 2018-07-16 MED ORDER — TRIAMCINOLONE ACETONIDE 0.1 % EX CREA: 1.0000 "application " | TOPICAL_CREAM | Freq: Every day | CUTANEOUS | 1 refills | Status: DC

## 2018-07-16 MED ORDER — CICLOPIROX 8 % EX SOLN: Freq: Every day | CUTANEOUS | 2 refills | Status: DC

## 2018-07-16 MED ORDER — CLOTRIMAZOLE 1 % EX OINT: 1.0000 cm | TOPICAL_OINTMENT | Freq: Two times a day (BID) | CUTANEOUS | 1 refills | Status: DC

## 2018-07-16 NOTE — Progress Notes (Signed)
Terri Donaldson DOB: [birth year] Encounter date: 07/16/2018  This isa 32 y.o. female who presents to establish care. Chief Complaint  Patient presents with  . New Patient (Initial Visit)    spot on the bottom of the left foot, treated with athete foot spray, and little toenail has hardened,     History of present illness: Had son 4 weeks ago; first baby. 9lb 14oz - had C section.   Asthma: exercise induced. Hasn't had inhaler in years. Did fine in pregnancy.   Seasonal allergies/animal allergies. Allergic to most animals except dogs.   Gets screening done at work that includes cholesterol screening.   Anemia during pregnancy; taking iron now. Hb wnl prior to c section. No dizziness, light headedness.    Past Medical History:  Diagnosis Date  . Anemia    Past Surgical History:  Procedure Laterality Date  . CESAREAN SECTION N/A 06/17/2018   Procedure: CESAREAN SECTION;  Surgeon: Waynard Reeds, MD;  Location: [facility] BIRTHING SUITES;  Service: Obstetrics;  Laterality: N/A;  RNFA IF POSSIBLE  . FOOT SURGERY  2007  . foot surgery     No Known Allergies Current Meds  Medication Sig  . ferrous sulfate 325 (65 FE) MG tablet Take 325 mg by mouth daily with breakfast.  . Prenatal Vit-Fe Fumarate-FA (PRENATAL MULTIVITAMIN) TABS tablet Take 1 tablet by mouth daily.    Social History   Tobacco Use  . Smoking status: Never Smoker  . Smokeless tobacco: Never Used  Substance Use Topics  . Alcohol use: No    Frequency: Never   Family History  Problem Relation Age of Onset  . Osteoporosis Mother   . Hypertension Father   . Hypertension Maternal Grandfather   . Stroke Maternal Grandfather 32  . Osteoporosis Maternal Grandmother   . Osteoporosis Maternal Aunt   . Thyroid cancer Maternal Aunt   . Breast cancer Maternal Aunt 45     Review of Systems  Constitutional: Negative for chills, fatigue and fever.  Respiratory: Negative for cough, chest tightness, shortness of breath and  wheezing.   Cardiovascular: Negative for chest pain, palpitations and leg swelling.  Gastrointestinal: Negative for abdominal pain.  Genitourinary: Negative for difficulty urinating.  Psychiatric/Behavioral: Negative for sleep disturbance and suicidal ideas. The patient is not nervous/anxious.     Objective:  BP 120/72 (BP Location: Left Arm, Patient Position: Sitting, Cuff Size: Normal)   Pulse 83   Temp 98.4 F (36.9 C) (Oral)   Ht 5\' 4"  (1.626 m)   Wt 126 lb 9.6 oz (57.4 kg)   SpO2 99%   BMI 21.73 kg/m   Weight: 126 lb 9.6 oz (57.4 kg)   BP Readings from Last 3 Encounters:  07/16/18 120/72  06/20/18 (!) 144/91  12/13/17 127/76   Wt Readings from Last 3 Encounters:  07/16/18 126 lb 9.6 oz (57.4 kg)  06/17/18 169 lb 6.4 oz (76.8 kg)  06/16/18 168 lb (76.2 kg)    Physical Exam  Constitutional: She is oriented to person, place, and time. She appears well-developed and well-nourished. No distress.  HENT:  Mouth/Throat: Oropharynx is clear and moist.  Neck: Neck supple.  Cardiovascular: Normal rate, regular rhythm and normal heart sounds. Exam reveals no friction rub.  No murmur heard. No lower extremity edema  Pulmonary/Chest: Effort normal and breath sounds normal. No respiratory distress. She has no wheezes. She has no rales.  Lymphadenopathy:    She has no cervical adenopathy.  Neurological: She is alert and oriented to person,  place, and time.  Psychiatric: Her behavior is normal. Cognition and memory are normal.    Assessment/Plan: 1. Athlete's foot on left Topical treatment (avoiding orals due to lactation) - Clotrimazole 1 % OINT; Apply 1 cm topically 2 (two) times daily.  Dispense: 30 g; Refill: 1 - triamcinolone cream (KENALOG) 0.1 %; Apply 1 application topically at bedtime.  Dispense: 15 g; Refill: 1  2. Onychomycosis Care of nails discussed. - ciclopirox (PENLAC) 8 % solution; Apply topically at bedtime. Apply over nail/surrounding skin daily.After seven  (7) days,remove with alcohol and continue cycle.  Dispense: 6.6 mL; Refill: 2  Return in about 6 months (around 01/15/2019) for physical exam.  Theodis Shove, MD Record release for obgyn was signed today.

## 2018-07-16 NOTE — Patient Instructions (Signed)
Apply the clotrimazole twice daily. At bedtime; put the triamcinolone on top of the clotrimazole. Use disposable nail files to help get off scaling, thickened nail.

## 2018-07-20 ENCOUNTER — Encounter: Payer: Self-pay | Admitting: Family Medicine

## 2018-07-23 DIAGNOSIS — R87615 Unsatisfactory cytologic smear of cervix: Secondary | ICD-10-CM | POA: Diagnosis not present

## 2018-07-29 ENCOUNTER — Encounter: Payer: Self-pay | Admitting: Family Medicine

## 2018-09-04 ENCOUNTER — Encounter: Payer: Self-pay | Admitting: Family Medicine

## 2018-09-04 ENCOUNTER — Ambulatory Visit (INDEPENDENT_AMBULATORY_CARE_PROVIDER_SITE_OTHER): Payer: BLUE CROSS/BLUE SHIELD | Admitting: Family Medicine

## 2018-09-04 VITALS — BP 116/62 | HR 87 | Temp 97.7°F | Wt 120.4 lb

## 2018-09-04 DIAGNOSIS — B070 Plantar wart: Secondary | ICD-10-CM

## 2018-09-04 DIAGNOSIS — L301 Dyshidrosis [pompholyx]: Secondary | ICD-10-CM | POA: Diagnosis not present

## 2018-09-04 DIAGNOSIS — B351 Tinea unguium: Secondary | ICD-10-CM | POA: Diagnosis not present

## 2018-09-04 NOTE — Progress Notes (Signed)
  Juluis Mireracy Michelle Mooers DOB: 12/04/1985 Encounter date: 09/04/2018  This is a 32 y.o. female who presents with Chief Complaint  Patient presents with  . Follow-up    History of present illness:  Here today for follow up of athletes foot. Has not noted significant improvement in toenail (maybe less thick) or foot. Not itching or irritated. Just thickened.   Also has wart on bottom of left great toe. Wondering about getting it treated.     No Known Allergies Current Meds  Medication Sig  . ciclopirox (PENLAC) 8 % solution Apply topically at bedtime. Apply over nail/surrounding skin daily.After seven (7) days,remove with alcohol and continue cycle.  . Clotrimazole 1 % OINT Apply 1 cm topically 2 (two) times daily.  . ferrous sulfate 325 (65 FE) MG tablet Take 325 mg by mouth daily with breakfast.  . Prenatal Vit-Fe Fumarate-FA (PRENATAL MULTIVITAMIN) TABS tablet Take 1 tablet by mouth daily.   Marland Kitchen. triamcinolone cream (KENALOG) 0.1 % Apply 1 application topically at bedtime.    Review of Systems  Skin:       Has noted "lump" on scalp. Not tender, not bothering her, just there.   See hpi.    Objective:  BP 116/62 (BP Location: Left Arm, Patient Position: Sitting, Cuff Size: Normal)   Pulse 87   Temp 97.7 F (36.5 C) (Oral)   Wt 120 lb 6.4 oz (54.6 kg)   SpO2 98%   BMI 20.67 kg/m   Weight: 120 lb 6.4 oz (54.6 kg)   BP Readings from Last 3 Encounters:  09/04/18 116/62  07/16/18 120/72  06/20/18 (!) 144/91   Wt Readings from Last 3 Encounters:  09/04/18 120 lb 6.4 oz (54.6 kg)  07/16/18 126 lb 9.6 oz (57.4 kg)  06/17/18 169 lb 6.4 oz (76.8 kg)    Physical Exam  Constitutional: She appears well-developed and well-nourished. No distress.  HENT:  Head: Normocephalic and atraumatic.  Pulmonary/Chest: Effort normal.  Skin:  There is mobile 0.5cm cyst anterior scalp.   Scaling has improved on left foot. There is residual callous. There is small plantar wart bottom of  great left toe.   Liquid nitrogen was applied to wart after callous was removed with dermal currette. 3 cycles of nitrogen applied to wart with 1mm ring surrounding affected area. Patient tolerated procedure well.     Assessment/Plan  1. Dyshidrotic eczema Well controlled. Discussed wearing breathable socks and shoes. She does note condition improves in winter when not wearing sandals. Keep callous skin filed down. OK to use steroid cream if worsening. No evidence for fungal infection at this point.   2. Plantar wart of left foot Treated today. Return if wart still present in 2 weeks time.   3.continue topical treatmend for onychomycosis. Breast feeding limits oral treatment options. File surface of nail to keep thin.   Return if symptoms worsen or fail to improve.    Theodis ShoveJunell Smita Lesh, MD

## 2018-09-04 NOTE — Patient Instructions (Addendum)
If wart has not resolved in 2 weeks time please return for retreatment.    Dyshidrotic Eczema Dyshidrotic eczema (pompholyx) is a type of eczema that causes very itchy (pruritic), fluid-filled blisters (vesicles) to form on the hands and feet. It can affect people of any age, but is more common before the age of 58. There is no cure, but treatment and certain lifestyle changes can help relieve symptoms. What are the causes? The cause of this condition is not known. What increases the risk? You are more likely to develop this condition if:  You wash your hands frequently.  You have a personal history or family history of eczema, allergies, asthma, or hay fever.  You are allergic to metals such as nickel or cobalt.  You work with cement.  You smoke.  What are the signs or symptoms? Symptoms of this condition may affect the hands, feet, or both. Symptoms may come and go (recur), and may include:  Severe itching, which may happen before blisters appear.  Blisters. These may form suddenly. ? In the early stages, blisters may form near the fingertips. ? In severe cases, blisters may grow to large blister masses (bullae). ? Blisters resolve in 2-3 weeks without bursting. This is followed by a dry phase in which itching eases.  Pain and swelling.  Cracks or long, narrow openings (fissures) in the skin.  Severe dryness.  Ridges on the nails.  How is this diagnosed? This condition may be diagnosed based on:  A physical exam.  Your symptoms.  Your medical history.  Skin scrapings to rule out a fungal infection.  Testing a swab of fluid for bacteria (culture).  Removing and checking a small piece of skin (biopsy) in order to test for infection or to rule out other conditions.  Skin patch tests. These tests involve taking patches that contain possible allergens and placing them on your back. Your health care provider will wait a few days and then check to see if an allergic  reaction occurred. These tests may be done if your health care provider suspects allergic reactions, or to rule out other types of eczema.  You may be referred to a health care provider who specializes in the skin (dermatologist) to help diagnose and treat this condition. How is this treated? There is no cure for this condition, but treatment can help relieve symptoms. Depending on how many blisters you have and how severe they are, your health care provider may suggest:  Avoiding allergens, irritants, or triggers that worsen symptoms. This may involve lifestyle changes such as: ? Using different lotions or soaps. ? Avoiding hot weather or places that will cause you to sweat a lot. ? Managing stress with coping techniques such as relaxation and exercise, and asking for help when you need it. ? Diet changes as recommended by your health care provider.  Using a clean, damp towel (cool compress) to relieve symptoms.  Soaking in a bath that contains a type of salt that relieves irritation (aluminum acetate soaks).  Medicine taken by mouth to reduce itching (oral antihistamines).  Medicine applied to the skin to reduce swelling and irritation (topical corticosteroids).  Medicine that reduces the activity of the body's disease-fighting system (immunosuppressants) to treat inflammation. This may be given in severe cases.  Antibiotic medicines to treat bacterial infection.  Light therapy (phototherapy). This involves shining ultraviolet (UV) light on affected skin in order to reduce itchiness and inflammation.  Follow these instructions at home: Bathing and skin care  Wash skin gently. After bathing or washing your hands, pat your skin dry. Avoid rubbing your skin.  Remove all jewelry before bathing. If the skin under the jewelry stays wet, blisters may form or get worse.  Apply cool compresses as told by your health care provider: ? Soak a clean towel in cool water. ? Wring out excess  water until towel is damp. ? Place the towel over affected skin. Leave the towel on for 20 minutes at a time, 2-3 times a day.  Use mild soaps, cleansers, and lotions that do not contain dyes, perfumes, or other irritants.  Keep your skin hydrated. To do this: ? Avoid very hot water. Take lukewarm baths or showers. ? Apply moisturizer within three minutes of bathing. This locks in moisture. Medicines  Take and apply over-the-counter and prescription medicines only as told by your health care provider.  If you were prescribed antibiotic medicine, take or apply it as told by your health care provider. Do not stop using the antibiotic even if you start to feel better. General instructions  Identify and avoid triggers and allergens.  Keep fingernails short to avoid breaking open the skin while scratching.  Use waterproof gloves to protect your hands when doing work that keeps your hands wet for a long time.  Wear socks to keep your feet dry.  Do not use any products that contain nicotine or tobacco, such as cigarettes and e-cigarettes. If you need help quitting, ask your health care provider.  Keep all follow-up visits as told by your health care provider. This is important. Contact a health care provider if:  You have symptoms that do not go away.  You have signs of infection, such as: ? Crusting, pus, or a bad smell. ? More redness, swelling, or pain. ? Increased warmth in the affected area. Summary  Dyshidrotic eczema (pompholyx) is a type of eczema that causes very itchy (pruritic), fluid-filled blisters (vesicles) to form on the hands and feet.  The cause of this condition is not known.  There is no cure for this condition, but treatment can help relieve symptoms. Treatment depends on how many blisters you have and how severe they are.  Use mild soaps, cleansers, and lotions that do not contain dyes, perfumes, or other irritants. Keep your skin hydrated. This information  is not intended to replace advice given to you by your health care provider. Make sure you discuss any questions you have with your health care provider. Document Released: 02/13/2017 Document Revised: 02/13/2017 Document Reviewed: 02/13/2017 Elsevier Interactive Patient Education  2018 [employer]

## 2018-10-02 ENCOUNTER — Ambulatory Visit (INDEPENDENT_AMBULATORY_CARE_PROVIDER_SITE_OTHER): Payer: BLUE CROSS/BLUE SHIELD | Admitting: Family Medicine

## 2018-10-02 ENCOUNTER — Encounter: Payer: Self-pay | Admitting: Family Medicine

## 2018-10-02 VITALS — BP 118/60 | HR 72 | Temp 97.7°F | Wt 116.9 lb

## 2018-10-02 DIAGNOSIS — B078 Other viral warts: Secondary | ICD-10-CM

## 2018-10-02 NOTE — Progress Notes (Signed)
Cryotherapy Procedure Note  Pre-operative Diagnosis:   Post-operative Diagnosis: same  Locations: left plantar foot; base great toe.  Lesion has improved. There is 1mm wart that is present currently.  Procedure Details  Patient informed of the risks, including bleeding and infection, and benefits of the  procedure and Verbal informed consent obtained. The lesion and surrounding area was cleansed with alcohol and lesion was shaved superficially using a dermablade.  Three cycles of liquid nitrogen applied to the area with 1-222mm perimeter with pause between cycles. Patient tolerated procedure well.  Complications: none.  Plan: 1. Discussed that there may be blister formation. Keep wound clean, dry. OK to apply antibiotic ointment if needed.  2. Warning signs of infection were reviewed.   3. Return in 2 weeks for re-treatment if lesion persists.  Theodis ShoveJunell Koberlein, MD

## 2018-10-23 ENCOUNTER — Encounter: Payer: Self-pay | Admitting: Family Medicine

## 2018-10-23 ENCOUNTER — Ambulatory Visit: Payer: BLUE CROSS/BLUE SHIELD | Admitting: Family Medicine

## 2018-10-23 VITALS — BP 110/60 | HR 83 | Temp 98.6°F | Wt 119.1 lb

## 2018-10-23 DIAGNOSIS — G629 Polyneuropathy, unspecified: Secondary | ICD-10-CM

## 2018-10-23 NOTE — Progress Notes (Signed)
Terri Donaldson DOB: [birth year] Encounter date: 10/23/2018  This is a 33 y.o. female who presents with Chief Complaint  Patient presents with  . foot stiffness    x 2 weeks, left foot, no know injury, no bruising/ swelling, pt states the foot is cold to the touch    History of present illness:  No idea what is going on. No change in symptoms (better/worse). Can't extend foot. No swelling.   Just can't extend foot. Not painful. "just won't go" sometimes feels numb/tingly. Sometime pain up left leg.      No Known Allergies Current Meds  Medication Sig  . ciclopirox (PENLAC) 8 % solution Apply topically at bedtime. Apply over nail/surrounding skin daily.After seven (7) days,remove with alcohol and continue cycle.  . Prenatal Vit-Fe Fumarate-FA (PRENATAL MULTIVITAMIN) TABS tablet Take 1 tablet by mouth daily.     Review of Systems  Constitutional: Negative for chills, fatigue and fever.  Respiratory: Negative for cough, chest tightness, shortness of breath and wheezing.   Cardiovascular: Negative for chest pain, palpitations and leg swelling.    Objective:  BP 110/60 (BP Location: Left Arm, Patient Position: Sitting, Cuff Size: Normal)   Pulse 83   Temp 98.6 F (37 C) (Oral)   Wt 119 lb 1.6 oz (54 kg)   SpO2 97%   BMI 20.44 kg/m   Weight: 119 lb 1.6 oz (54 kg)   BP Readings from Last 3 Encounters:  10/23/18 110/60  10/02/18 118/60  09/04/18 116/62   Wt Readings from Last 3 Encounters:  10/23/18 119 lb 1.6 oz (54 kg)  10/02/18 116 lb 14.4 oz (53 kg)  09/04/18 120 lb 6.4 oz (54.6 kg)    Physical Exam Constitutional:      General: She is not in acute distress.    Appearance: She is well-developed. She is not diaphoretic.  HENT:     Head: Normocephalic and atraumatic.     Right Ear: External ear normal.     Left Ear: External ear normal.  Eyes:     Conjunctiva/sclera: Conjunctivae normal.     Pupils: Pupils are equal, round, and reactive to light.   Neck:     Musculoskeletal: Neck supple.     Thyroid: No thyromegaly.  Cardiovascular:     Rate and Rhythm: Normal rate and regular rhythm.     Heart sounds: Normal heart sounds. No murmur. No friction rub. No gallop.      Comments: Negative Homans.  Pulmonary:     Effort: Pulmonary effort is normal. No respiratory distress.     Breath sounds: Normal breath sounds. No wheezing or rales.  Musculoskeletal:     Right lower leg: No edema.     Left lower leg: No edema.     Comments: No bony tenderness off foot.  Lymphadenopathy:     Cervical: No cervical adenopathy.  Skin:    General: Skin is warm and dry.     Comments: Warm, well perfused.  Neurological:     Mental Status: She is alert and oriented to person, place, and time.     Cranial Nerves: No cranial nerve deficit.     Sensory: Sensation is intact. No sensory deficit.     Motor: No abnormal muscle tone.     Deep Tendon Reflexes: Reflexes normal. Babinski sign absent on the right side. Babinski sign absent on the left side.     Reflex Scores:      Tricep reflexes are 2+ on the right side and  2+ on the left side.      Bicep reflexes are 2+ on the right side and 2+ on the left side.      Brachioradialis reflexes are 2+ on the right side and 2+ on the left side.      Patellar reflexes are 2+ on the right side and 2+ on the left side.      Achilles reflexes are 2+ on the right side and 2+ on the left side.    Comments: Strength is intact throughout body except that there is weakness with dorsiflexion of left foot. Full strength toes.   Psychiatric:        Behavior: Behavior normal.     Assessment/Plan 1. Neuropathy Possible peroneal nerve issue? Will get some baseline neurological labs; further evaluation pending these results. Let us know if any worsening of symptoms/additional symptoms.  - CBC with Differential/Platelet; Future - Comprehensive metabolic panel; Future - TSH; Future - Vitamin B12; Future - ANA; Future -  Folate; Future    Return if symptoms worsen or fail to improve.     Theodis Shove, MD

## 2018-10-24 ENCOUNTER — Encounter: Payer: Self-pay | Admitting: Family Medicine

## 2018-10-26 ENCOUNTER — Other Ambulatory Visit (INDEPENDENT_AMBULATORY_CARE_PROVIDER_SITE_OTHER): Payer: BLUE CROSS/BLUE SHIELD

## 2018-10-26 DIAGNOSIS — G629 Polyneuropathy, unspecified: Secondary | ICD-10-CM | POA: Diagnosis not present

## 2018-10-26 LAB — CBC WITH DIFFERENTIAL/PLATELET
BASOS PCT: 1.1 % (ref 0.0–3.0)
Basophils Absolute: 0.1 10*3/uL (ref 0.0–0.1)
EOS ABS: 0.1 10*3/uL (ref 0.0–0.7)
Eosinophils Relative: 1.9 % (ref 0.0–5.0)
HCT: 39.1 % (ref 36.0–46.0)
Hemoglobin: 13.1 g/dL (ref 12.0–15.0)
LYMPHS ABS: 1.9 10*3/uL (ref 0.7–4.0)
Lymphocytes Relative: 32.1 % (ref 12.0–46.0)
MCHC: 33.5 g/dL (ref 30.0–36.0)
MCV: 85.5 fl (ref 78.0–100.0)
Monocytes Absolute: 0.3 10*3/uL (ref 0.1–1.0)
Monocytes Relative: 5.5 % (ref 3.0–12.0)
NEUTROS PCT: 59.4 % (ref 43.0–77.0)
Neutro Abs: 3.5 10*3/uL (ref 1.4–7.7)
PLATELETS: 233 10*3/uL (ref 150.0–400.0)
RBC: 4.57 Mil/uL (ref 3.87–5.11)
RDW: 13.6 % (ref 11.5–15.5)
WBC: 5.9 10*3/uL (ref 4.0–10.5)

## 2018-10-26 LAB — COMPREHENSIVE METABOLIC PANEL
ALT: 13 U/L (ref 0–35)
AST: 16 U/L (ref 0–37)
Albumin: 4.5 g/dL (ref 3.5–5.2)
Alkaline Phosphatase: 77 U/L (ref 39–117)
BILIRUBIN TOTAL: 0.7 mg/dL (ref 0.2–1.2)
BUN: 22 mg/dL (ref 6–23)
CHLORIDE: 104 meq/L (ref 96–112)
CO2: 26 meq/L (ref 19–32)
Calcium: 9.6 mg/dL (ref 8.4–10.5)
Creatinine, Ser: 0.78 mg/dL (ref 0.40–1.20)
GFR: 90.67 mL/min (ref >60.00)
GLUCOSE: 61 mg/dL — AB (ref 70–99)
Potassium: 4.1 mEq/L (ref 3.5–5.1)
Sodium: 139 mEq/L (ref 135–145)
Total Protein: 7.1 g/dL (ref 6.0–8.3)

## 2018-10-26 LAB — VITAMIN B12: Vitamin B-12: 336 pg/mL (ref 211–911)

## 2018-10-26 LAB — TSH: TSH: 1.02 u[IU]/mL (ref 0.35–4.50)

## 2018-10-26 LAB — FOLATE: Folate: 13.4 ng/mL (ref >5.9)

## 2018-10-28 LAB — ANA: ANA: NEGATIVE

## 2018-11-05 ENCOUNTER — Other Ambulatory Visit: Payer: Self-pay | Admitting: Family Medicine

## 2018-11-06 ENCOUNTER — Other Ambulatory Visit: Payer: Self-pay | Admitting: Family Medicine

## 2018-11-06 DIAGNOSIS — M21372 Foot drop, left foot: Secondary | ICD-10-CM

## 2018-11-06 NOTE — Telephone Encounter (Signed)
I have ordered [facility] - just wanted to make sure I did this correctly as I assume it comes to you for approval/scheduling? Please let me know if something else is needed. Thanks!

## 2018-11-10 ENCOUNTER — Encounter: Payer: Self-pay | Admitting: Family Medicine

## 2018-11-11 ENCOUNTER — Other Ambulatory Visit: Payer: Self-pay | Admitting: Family Medicine

## 2018-11-11 DIAGNOSIS — M21372 Foot drop, left foot: Secondary | ICD-10-CM

## 2018-11-11 NOTE — Telephone Encounter (Signed)
I have looked through the chart and do not see any notes regarding this...maybe I over looked them?

## 2018-11-12 NOTE — Telephone Encounter (Signed)
Patient states that [facility] are not in network. Is [facility] different? Just passing along her concerns. Thanks!

## 2018-11-16 ENCOUNTER — Ambulatory Visit: Payer: BLUE CROSS/BLUE SHIELD | Admitting: Neurology

## 2018-12-02 ENCOUNTER — Encounter: Payer: Self-pay | Admitting: Family Medicine

## 2019-07-16 DIAGNOSIS — Z23 Encounter for immunization: Secondary | ICD-10-CM | POA: Diagnosis not present

## 2019-07-26 ENCOUNTER — Telehealth: Payer: Self-pay | Admitting: *Deleted

## 2019-07-26 NOTE — Telephone Encounter (Signed)
I called Terri Donaldson and asked what was needed as I do not recall receiving a fax.  She stated she is trying to file an appeal as the labs from January (vitamin J00 and folic acid) were denied by [employer] due to being experimental and investigational.   Message sent to Dr Ethlyn Gallery.

## 2019-07-26 NOTE — Telephone Encounter (Signed)
Copied from [place] [id number]. Topic: General - Inquiry >> Jul 26, 2019  2:25 PM Scherrie Gerlach wrote: Reason for CRM: Ana with [employer] calling to follow up on 3 faxes she sent for a letter of medical necessity for labs that were drawn on 10/26/2018. Would like a call back [phone]  ext [phone] Last request sent 07/12/19 Terri Donaldson is going to send again. She needs by asap, as the appeal deadline in next week

## 2019-07-27 NOTE — Telephone Encounter (Signed)
I also do not remember seeing any fax.   She had a significant neuropathy so I do not feel that labs were unjustified. Please just keep look out for that paperwork to be re-faxed. You could also let Vanette know we are working on this.

## 2019-07-27 NOTE — Telephone Encounter (Signed)
I called Terri Donaldson and informed her of the message below.  She stated she needs this information in writing and will fax the form again to my attention.

## 2019-07-27 NOTE — Telephone Encounter (Signed)
Fax received and placed on Dr Koberlein's desk. 

## 2019-07-28 ENCOUNTER — Encounter: Payer: Self-pay | Admitting: Family Medicine

## 2019-07-28 NOTE — Telephone Encounter (Signed)
All papers faxed to [employer] [facility] at [phone] attn: Maryla Morrow.

## 2019-07-28 NOTE — Telephone Encounter (Signed)
I wrote a letter and printed office note with details of visit and placed this on your desk to fax back to them. Fax number is on paperwork. Thanks!

## 2019-08-16 DIAGNOSIS — Z6820 Body mass index (BMI) 20.0-20.9, adult: Secondary | ICD-10-CM | POA: Diagnosis not present

## 2019-08-16 DIAGNOSIS — Z124 Encounter for screening for malignant neoplasm of cervix: Secondary | ICD-10-CM | POA: Diagnosis not present

## 2019-08-16 DIAGNOSIS — Z01419 Encounter for gynecological examination (general) (routine) without abnormal findings: Secondary | ICD-10-CM | POA: Diagnosis not present

## 2019-09-22 DIAGNOSIS — N925 Other specified irregular menstruation: Secondary | ICD-10-CM | POA: Diagnosis not present

## 2019-09-22 DIAGNOSIS — Z3201 Encounter for pregnancy test, result positive: Secondary | ICD-10-CM | POA: Diagnosis not present

## 2019-09-22 DIAGNOSIS — Z6820 Body mass index (BMI) 20.0-20.9, adult: Secondary | ICD-10-CM | POA: Diagnosis not present

## 2019-09-22 DIAGNOSIS — Z113 Encounter for screening for infections with a predominantly sexual mode of transmission: Secondary | ICD-10-CM | POA: Diagnosis not present

## 2019-10-19 LAB — OB RESULTS CONSOLE RPR: RPR: NONREACTIVE

## 2019-10-19 LAB — OB RESULTS CONSOLE ANTIBODY SCREEN: Antibody Screen: NEGATIVE

## 2019-10-19 LAB — OB RESULTS CONSOLE GC/CHLAMYDIA
Chlamydia: NEGATIVE
Gonorrhea: NEGATIVE

## 2019-10-19 LAB — OB RESULTS CONSOLE HIV ANTIBODY (ROUTINE TESTING): HIV: NONREACTIVE

## 2019-10-19 LAB — OB RESULTS CONSOLE HEPATITIS B SURFACE ANTIGEN: Hepatitis B Surface Ag: NEGATIVE

## 2019-10-19 LAB — OB RESULTS CONSOLE ABO/RH: RH Type: POSITIVE

## 2019-10-19 LAB — OB RESULTS CONSOLE RUBELLA ANTIBODY, IGM: Rubella: IMMUNE

## 2020-03-14 ENCOUNTER — Other Ambulatory Visit: Payer: Self-pay | Admitting: Obstetrics and Gynecology

## 2020-03-29 ENCOUNTER — Encounter: Payer: Self-pay | Admitting: Family Medicine

## 2020-04-06 ENCOUNTER — Encounter: Payer: Self-pay | Admitting: Obstetrics and Gynecology

## 2020-04-07 ENCOUNTER — Other Ambulatory Visit: Payer: Self-pay | Admitting: Obstetrics & Gynecology

## 2020-04-07 DIAGNOSIS — O359XX9 Maternal care for (suspected) fetal abnormality and damage, unspecified, other fetus: Secondary | ICD-10-CM

## 2020-04-10 ENCOUNTER — Other Ambulatory Visit: Payer: Self-pay

## 2020-04-10 ENCOUNTER — Other Ambulatory Visit: Payer: Self-pay | Admitting: Obstetrics & Gynecology

## 2020-04-10 ENCOUNTER — Ambulatory Visit: Payer: No Typology Code available for payment source | Attending: Obstetrics & Gynecology

## 2020-04-10 DIAGNOSIS — Z363 Encounter for antenatal screening for malformations: Secondary | ICD-10-CM

## 2020-04-10 DIAGNOSIS — O359XX9 Maternal care for (suspected) fetal abnormality and damage, unspecified, other fetus: Secondary | ICD-10-CM

## 2020-04-10 DIAGNOSIS — O358XX0 Maternal care for other (suspected) fetal abnormality and damage, not applicable or unspecified: Secondary | ICD-10-CM | POA: Insufficient documentation

## 2020-04-10 DIAGNOSIS — O359XX0 Maternal care for (suspected) fetal abnormality and damage, unspecified, not applicable or unspecified: Secondary | ICD-10-CM | POA: Diagnosis not present

## 2020-04-10 DIAGNOSIS — Z3A35 35 weeks gestation of pregnancy: Secondary | ICD-10-CM | POA: Insufficient documentation

## 2020-04-10 DIAGNOSIS — O34219 Maternal care for unspecified type scar from previous cesarean delivery: Secondary | ICD-10-CM | POA: Diagnosis not present

## 2020-04-13 LAB — OB RESULTS CONSOLE GBS: GBS: NEGATIVE

## 2020-04-19 ENCOUNTER — Encounter (HOSPITAL_COMMUNITY): Payer: Self-pay | Admitting: *Deleted

## 2020-04-19 NOTE — Patient Instructions (Signed)
Terri Donaldson  04/19/2020   Your procedure is scheduled on:  05/05/2020  Arrive at 1000 at Entrance C on [address] at [facility]  and [employer]. You are invited to use the FREE valet parking or use the Visitor's parking deck.  Pick up the phone at the desk and dial [phone].  Call this number if you have problems the morning of surgery: [phone]  Remember:   Do not eat food:(After Midnight) Desps de medianoche.  Do not drink clear liquids: (After Midnight) Desps de medianoche.  Take these medicines the morning of surgery with A SIP OF WATER:  none   Do not wear jewelry, make-up or nail polish.  Do not wear lotions, powders, or perfumes. Do not wear deodorant.  Do not shave 48 hours prior to surgery.  Do not bring valuables to the hospital.  [facility] is not   responsible for any belongings or valuables brought to the hospital.  Contacts, dentures or bridgework may not be worn into surgery.  Leave suitcase in the car. After surgery it may be brought to your room.  For patients admitted to the hospital, checkout time is 11:00 AM the day of              discharge.      Please read over the following fact sheets that you were given:     Preparing for Surgery

## 2020-04-20 ENCOUNTER — Encounter (HOSPITAL_COMMUNITY): Payer: Self-pay

## 2020-05-01 NOTE — Anesthesia Preprocedure Evaluation (Addendum)
Anesthesia Evaluation  Patient identified by MRN, date of birth, ID band Patient awake    Reviewed: Allergy & Precautions, NPO status , Patient's Chart, lab work & pertinent test results  History of Anesthesia Complications Negative for: history of anesthetic complications  Airway Mallampati: II  TM Distance: >3 FB Neck ROM: Full    Dental no notable dental hx.    Pulmonary neg pulmonary ROS,    Pulmonary exam normal        Cardiovascular negative cardio ROS Normal cardiovascular exam     Neuro/Psych negative neurological ROS  negative psych ROS   GI/Hepatic negative GI ROS, Neg liver ROS,   Endo/Other  negative endocrine ROS  Renal/GU negative Renal ROS  negative genitourinary   Musculoskeletal negative musculoskeletal ROS (+)   Abdominal   Peds  Hematology Hgb 11.3, plt 169   Anesthesia Other Findings Day of surgery medications reviewed with patient.  Reproductive/Obstetrics (+) Pregnancy (Hx of C/S x1)                            Anesthesia Physical Anesthesia Plan  ASA: II  Anesthesia Plan: Spinal   Post-op Pain Management:    Induction:   PONV Risk Score and Plan: 4 or greater and Treatment may vary due to age or medical condition, Ondansetron and Dexamethasone  Airway Management Planned: Natural Airway  Additional Equipment: None  Intra-op Plan:   Post-operative Plan:   Informed Consent: I have reviewed the patients History and Physical, chart, labs and discussed the procedure including the risks, benefits and alternatives for the proposed anesthesia with the patient or authorized representative who has indicated his/her understanding and acceptance.       Plan Discussed with: CRNA  Anesthesia Plan Comments:        Anesthesia Quick Evaluation

## 2020-05-03 ENCOUNTER — Other Ambulatory Visit (HOSPITAL_COMMUNITY)
Admission: RE | Admit: 2020-05-03 | Discharge: 2020-05-03 | Disposition: A | Discharge status: Home / Self Care | Payer: No Typology Code available for payment source | Source: Ambulatory Visit | Attending: Obstetrics and Gynecology | Admitting: Obstetrics and Gynecology

## 2020-05-03 ENCOUNTER — Other Ambulatory Visit: Payer: Self-pay

## 2020-05-03 DIAGNOSIS — Z20822 Contact with and (suspected) exposure to covid-19: Secondary | ICD-10-CM | POA: Insufficient documentation

## 2020-05-03 DIAGNOSIS — Z01812 Encounter for preprocedural laboratory examination: Secondary | ICD-10-CM | POA: Insufficient documentation

## 2020-05-03 LAB — CBC
HCT: 34.4 % — ABNORMAL LOW (ref 36.0–46.0)
Hemoglobin: 11.3 g/dL — ABNORMAL LOW (ref 12.0–15.0)
MCH: 28.8 pg (ref 26.0–34.0)
MCHC: 32.8 g/dL (ref 30.0–36.0)
MCV: 87.5 fL (ref 80.0–100.0)
Platelets: 169 10*3/uL (ref 150–400)
RBC: 3.93 MIL/uL (ref 3.87–5.11)
RDW: 14.3 % (ref 11.5–15.5)
WBC: 8.4 10*3/uL (ref 4.0–10.5)
nRBC: 0 % (ref 0.0–0.2)

## 2020-05-03 LAB — TYPE AND SCREEN
ABO/RH(D): A POS
Antibody Screen: NEGATIVE

## 2020-05-03 LAB — RPR: RPR Ser Ql: NONREACTIVE

## 2020-05-03 LAB — SARS CORONAVIRUS 2 (TAT 6-24 HRS): SARS Coronavirus 2: NEGATIVE

## 2020-05-03 NOTE — MAU Note (Signed)
Asymptomatic, swab collected. Waiting on lab 

## 2020-05-05 ENCOUNTER — Other Ambulatory Visit: Payer: Self-pay

## 2020-05-05 ENCOUNTER — Inpatient Hospital Stay (HOSPITAL_COMMUNITY): Payer: No Typology Code available for payment source | Admitting: Anesthesiology

## 2020-05-05 ENCOUNTER — Inpatient Hospital Stay (HOSPITAL_COMMUNITY)
Admission: RE | Admit: 2020-05-05 | Discharge: 2020-05-07 | DRG: 788 | Disposition: A | Discharge status: Home / Self Care | Payer: No Typology Code available for payment source | Attending: Obstetrics and Gynecology | Admitting: Obstetrics and Gynecology

## 2020-05-05 ENCOUNTER — Encounter (HOSPITAL_COMMUNITY)
Admission: RE | Disposition: A | Discharge status: Home / Self Care | Payer: Self-pay | Source: Home / Self Care | Attending: Obstetrics and Gynecology

## 2020-05-05 ENCOUNTER — Encounter (HOSPITAL_COMMUNITY): Payer: Self-pay | Admitting: Obstetrics and Gynecology

## 2020-05-05 DIAGNOSIS — Z20822 Contact with and (suspected) exposure to covid-19: Secondary | ICD-10-CM | POA: Diagnosis present

## 2020-05-05 DIAGNOSIS — O34211 Maternal care for low transverse scar from previous cesarean delivery: Principal | ICD-10-CM | POA: Diagnosis present

## 2020-05-05 DIAGNOSIS — Z3A39 39 weeks gestation of pregnancy: Secondary | ICD-10-CM

## 2020-05-05 SURGERY — Surgical Case
Anesthesia: Spinal | Wound class: Clean Contaminated

## 2020-05-05 MED ORDER — METHYLERGONOVINE MALEATE 0.2 MG PO TABS: 0.2000 mg | ORAL_TABLET | ORAL | Status: DC | PRN

## 2020-05-05 MED ORDER — NALBUPHINE HCL 10 MG/ML IJ SOLN
5.0000 mg | INTRAMUSCULAR | Status: DC | PRN
  Administered 2020-05-06: 5 mg via INTRAVENOUS
  Filled 2020-05-05 (×2): qty 1

## 2020-05-05 MED ORDER — NALBUPHINE HCL 10 MG/ML IJ SOLN
5.0000 mg | Freq: Once | INTRAMUSCULAR | Status: AC | PRN
  Administered 2020-05-05: 5 mg via INTRAVENOUS

## 2020-05-05 MED ORDER — ACETAMINOPHEN 500 MG PO TABS: 1000.0000 mg | ORAL_TABLET | Freq: Once | ORAL | Status: DC

## 2020-05-05 MED ORDER — SODIUM CHLORIDE 0.9% FLUSH: 3.0000 mL | INTRAVENOUS | Status: DC | PRN

## 2020-05-05 MED ORDER — SIMETHICONE 80 MG PO CHEW
80.0000 mg | CHEWABLE_TABLET | ORAL | Status: DC
  Administered 2020-05-06 (×2): 80 mg via ORAL
  Filled 2020-05-05 (×2): qty 1

## 2020-05-05 MED ORDER — METHYLERGONOVINE MALEATE 0.2 MG/ML IJ SOLN: 0.2000 mg | INTRAMUSCULAR | Status: DC | PRN

## 2020-05-05 MED ORDER — MORPHINE SULFATE (PF) 0.5 MG/ML IJ SOLN
INTRAMUSCULAR | Status: DC | PRN
  Administered 2020-05-05: .15 mg via INTRATHECAL

## 2020-05-05 MED ORDER — NALOXONE HCL 0.4 MG/ML IJ SOLN: 0.4000 mg | INTRAMUSCULAR | Status: DC | PRN

## 2020-05-05 MED ORDER — DIBUCAINE (PERIANAL) 1 % EX OINT: 1.0000 "application " | TOPICAL_OINTMENT | CUTANEOUS | Status: DC | PRN

## 2020-05-05 MED ORDER — ACETAMINOPHEN 500 MG PO TABS
1000.0000 mg | ORAL_TABLET | Freq: Four times a day (QID) | ORAL | Status: AC
  Administered 2020-05-05 – 2020-05-06 (×3): 1000 mg via ORAL
  Filled 2020-05-05 (×3): qty 2

## 2020-05-05 MED ORDER — WITCH HAZEL-GLYCERIN EX PADS: 1.0000 "application " | MEDICATED_PAD | CUTANEOUS | Status: DC | PRN

## 2020-05-05 MED ORDER — PHENYLEPHRINE HCL-NACL 20-0.9 MG/250ML-% IV SOLN
INTRAVENOUS | Status: DC | PRN
  Administered 2020-05-05: 60 ug/min via INTRAVENOUS

## 2020-05-05 MED ORDER — COCONUT OIL OIL
1.0000 "application " | TOPICAL_OIL | Status: DC | PRN
  Administered 2020-05-05: 1 via TOPICAL

## 2020-05-05 MED ORDER — MENTHOL 3 MG MT LOZG: 1.0000 | LOZENGE | OROMUCOSAL | Status: DC | PRN

## 2020-05-05 MED ORDER — FENTANYL CITRATE (PF) 100 MCG/2ML IJ SOLN
INTRAMUSCULAR | Status: DC | PRN
  Administered 2020-05-05: 15 ug via INTRATHECAL

## 2020-05-05 MED ORDER — FENTANYL CITRATE (PF) 100 MCG/2ML IJ SOLN
INTRAMUSCULAR | Status: AC
  Filled 2020-05-05: qty 2

## 2020-05-05 MED ORDER — ONDANSETRON HCL 4 MG/2ML IJ SOLN
INTRAMUSCULAR | Status: AC
  Filled 2020-05-05: qty 2

## 2020-05-05 MED ORDER — CEFAZOLIN SODIUM-DEXTROSE 2-3 GM-%(50ML) IV SOLR
INTRAVENOUS | Status: DC | PRN
Start: 2020-05-05 — End: 2020-05-05
  Administered 2020-05-05: 2 g via INTRAVENOUS

## 2020-05-05 MED ORDER — FENTANYL CITRATE (PF) 100 MCG/2ML IJ SOLN: 25.0000 ug | INTRAMUSCULAR | Status: DC | PRN

## 2020-05-05 MED ORDER — ACETAMINOPHEN 160 MG/5ML PO SOLN: 1000.0000 mg | Freq: Once | ORAL | Status: DC

## 2020-05-05 MED ORDER — DEXAMETHASONE SODIUM PHOSPHATE 10 MG/ML IJ SOLN
INTRAMUSCULAR | Status: AC
  Filled 2020-05-05: qty 1

## 2020-05-05 MED ORDER — TETANUS-DIPHTH-ACELL PERTUSSIS 5-2.5-18.5 LF-MCG/0.5 IM SUSP: 0.5000 mL | Freq: Once | INTRAMUSCULAR | Status: DC

## 2020-05-05 MED ORDER — OXYTOCIN-SODIUM CHLORIDE 30-0.9 UT/500ML-% IV SOLN
INTRAVENOUS | Status: DC | PRN
  Administered 2020-05-05: 30 [IU] via INTRAVENOUS

## 2020-05-05 MED ORDER — OXYTOCIN-SODIUM CHLORIDE 30-0.9 UT/500ML-% IV SOLN
INTRAVENOUS | Status: AC
  Filled 2020-05-05: qty 500

## 2020-05-05 MED ORDER — KETOROLAC TROMETHAMINE 30 MG/ML IJ SOLN
INTRAMUSCULAR | Status: AC
  Filled 2020-05-05: qty 1

## 2020-05-05 MED ORDER — CEFAZOLIN SODIUM-DEXTROSE 2-4 GM/100ML-% IV SOLN
INTRAVENOUS | Status: AC
  Filled 2020-05-05: qty 100

## 2020-05-05 MED ORDER — ONDANSETRON HCL 4 MG/2ML IJ SOLN
INTRAMUSCULAR | Status: DC | PRN
  Administered 2020-05-05: 4 mg via INTRAVENOUS

## 2020-05-05 MED ORDER — SODIUM CHLORIDE 0.9 % IV SOLN
INTRAVENOUS | Status: DC | PRN
Start: 2020-05-05 — End: 2020-05-05

## 2020-05-05 MED ORDER — DEXAMETHASONE SODIUM PHOSPHATE 10 MG/ML IJ SOLN
INTRAMUSCULAR | Status: DC | PRN
  Administered 2020-05-05: 10 mg via INTRAVENOUS

## 2020-05-05 MED ORDER — MORPHINE SULFATE (PF) 0.5 MG/ML IJ SOLN
INTRAMUSCULAR | Status: AC
  Filled 2020-05-05: qty 10

## 2020-05-05 MED ORDER — OXYCODONE HCL 5 MG PO TABS: 5.0000 mg | ORAL_TABLET | ORAL | Status: DC | PRN

## 2020-05-05 MED ORDER — SIMETHICONE 80 MG PO CHEW: 80.0000 mg | CHEWABLE_TABLET | ORAL | Status: DC | PRN

## 2020-05-05 MED ORDER — ACETAMINOPHEN 325 MG PO TABS: 650.0000 mg | ORAL_TABLET | ORAL | Status: DC | PRN

## 2020-05-05 MED ORDER — OXYTOCIN-SODIUM CHLORIDE 30-0.9 UT/500ML-% IV SOLN: 2.5000 [IU]/h | INTRAVENOUS | Status: AC

## 2020-05-05 MED ORDER — DIPHENHYDRAMINE HCL 25 MG PO CAPS: 25.0000 mg | ORAL_CAPSULE | Freq: Four times a day (QID) | ORAL | Status: DC | PRN

## 2020-05-05 MED ORDER — KETOROLAC TROMETHAMINE 30 MG/ML IJ SOLN
30.0000 mg | Freq: Once | INTRAMUSCULAR | Status: AC
  Administered 2020-05-05: 30 mg via INTRAVENOUS

## 2020-05-05 MED ORDER — NALOXONE HCL 4 MG/10ML IJ SOLN
1.0000 ug/kg/h | INTRAVENOUS | Status: DC | PRN
  Filled 2020-05-05: qty 5

## 2020-05-05 MED ORDER — DIPHENHYDRAMINE HCL 25 MG PO CAPS: 25.0000 mg | ORAL_CAPSULE | ORAL | Status: DC | PRN

## 2020-05-05 MED ORDER — LACTATED RINGERS IV SOLN: INTRAVENOUS | Status: DC

## 2020-05-05 MED ORDER — SIMETHICONE 80 MG PO CHEW
80.0000 mg | CHEWABLE_TABLET | Freq: Three times a day (TID) | ORAL | Status: DC
  Administered 2020-05-05 – 2020-05-07 (×5): 80 mg via ORAL
  Filled 2020-05-05 (×5): qty 1

## 2020-05-05 MED ORDER — SODIUM CHLORIDE 0.9 % IR SOLN
Status: DC | PRN
  Administered 2020-05-05: 1000 mL

## 2020-05-05 MED ORDER — DIPHENHYDRAMINE HCL 50 MG/ML IJ SOLN: 12.5000 mg | INTRAMUSCULAR | Status: DC | PRN

## 2020-05-05 MED ORDER — PROMETHAZINE HCL 25 MG/ML IJ SOLN: 6.2500 mg | INTRAMUSCULAR | Status: DC | PRN

## 2020-05-05 MED ORDER — NALBUPHINE HCL 10 MG/ML IJ SOLN: 5.0000 mg | INTRAMUSCULAR | Status: DC | PRN

## 2020-05-05 MED ORDER — KETOROLAC TROMETHAMINE 30 MG/ML IJ SOLN: 30.0000 mg | Freq: Four times a day (QID) | INTRAMUSCULAR | Status: AC | PRN

## 2020-05-05 MED ORDER — NALBUPHINE HCL 10 MG/ML IJ SOLN: 5.0000 mg | Freq: Once | INTRAMUSCULAR | Status: AC | PRN

## 2020-05-05 MED ORDER — STERILE WATER FOR IRRIGATION IR SOLN
Status: DC | PRN
  Administered 2020-05-05: 1000 mL

## 2020-05-05 MED ORDER — SENNOSIDES-DOCUSATE SODIUM 8.6-50 MG PO TABS
2.0000 | ORAL_TABLET | ORAL | Status: DC
  Administered 2020-05-06 (×2): 2 via ORAL
  Filled 2020-05-05 (×2): qty 2

## 2020-05-05 MED ORDER — PRENATAL MULTIVITAMIN CH
1.0000 | ORAL_TABLET | Freq: Every day | ORAL | Status: DC
  Administered 2020-05-06: 1 via ORAL
  Filled 2020-05-05: qty 1

## 2020-05-05 MED ORDER — IBUPROFEN 800 MG PO TABS
800.0000 mg | ORAL_TABLET | Freq: Three times a day (TID) | ORAL | Status: DC
  Administered 2020-05-06 – 2020-05-07 (×3): 800 mg via ORAL
  Filled 2020-05-05 (×3): qty 1

## 2020-05-05 MED ORDER — BUPIVACAINE IN DEXTROSE 0.75-8.25 % IT SOLN
INTRATHECAL | Status: DC | PRN
  Administered 2020-05-05: 1.6 mL via INTRATHECAL

## 2020-05-05 MED ORDER — ONDANSETRON HCL 4 MG/2ML IJ SOLN: 4.0000 mg | Freq: Three times a day (TID) | INTRAMUSCULAR | Status: DC | PRN

## 2020-05-05 MED ORDER — LACTATED RINGERS IV SOLN
INTRAVENOUS | Status: DC | PRN
Start: 2020-05-05 — End: 2020-05-05

## 2020-05-05 MED ORDER — CEFAZOLIN SODIUM-DEXTROSE 2-4 GM/100ML-% IV SOLN: 2.0000 g | INTRAVENOUS | Status: DC

## 2020-05-05 MED ORDER — KETOROLAC TROMETHAMINE 30 MG/ML IJ SOLN
30.0000 mg | Freq: Four times a day (QID) | INTRAMUSCULAR | Status: AC | PRN
  Administered 2020-05-05: 30 mg via INTRAVENOUS
  Filled 2020-05-05: qty 1

## 2020-05-05 SURGICAL SUPPLY — 33 items
BENZOIN TINCTURE PRP APPL 2/3 (GAUZE/BANDAGES/DRESSINGS) ×2 IMPLANT
CHLORAPREP W/TINT 26ML (MISCELLANEOUS) ×2 IMPLANT
CLAMP CORD UMBIL (MISCELLANEOUS) IMPLANT
CLOSURE STERI STRIP 1/2 X4 (GAUZE/BANDAGES/DRESSINGS) ×2 IMPLANT
CLOTH BEACON ORANGE TIMEOUT ST (SAFETY) ×2 IMPLANT
DERMABOND ADVANCED (GAUZE/BANDAGES/DRESSINGS)
DERMABOND ADVANCED .7 DNX12 (GAUZE/BANDAGES/DRESSINGS) IMPLANT
DRSG OPSITE POSTOP 4X10 (GAUZE/BANDAGES/DRESSINGS) ×2 IMPLANT
ELECT REM PT RETURN 9FT ADLT (ELECTROSURGICAL) ×2
ELECTRODE REM PT RTRN 9FT ADLT (ELECTROSURGICAL) ×1 IMPLANT
EXTRACTOR VACUUM M CUP 4 TUBE (SUCTIONS) IMPLANT
GLOVE BIO SURGEON STRL SZ7 (GLOVE) ×2 IMPLANT
GLOVE BIOGEL PI IND STRL 7.0 (GLOVE) ×1 IMPLANT
GLOVE BIOGEL PI INDICATOR 7.0 (GLOVE) ×1
GOWN STRL REUS W/TWL LRG LVL3 (GOWN DISPOSABLE) ×4 IMPLANT
KIT ABG SYR 3ML LUER SLIP (SYRINGE) IMPLANT
NEEDLE HYPO 22GX1.5 SAFETY (NEEDLE) IMPLANT
NEEDLE HYPO 25X5/8 SAFETYGLIDE (NEEDLE) IMPLANT
NS IRRIG 1000ML POUR BTL (IV SOLUTION) ×2 IMPLANT
PACK C SECTION WH (CUSTOM PROCEDURE TRAY) ×2 IMPLANT
PAD OB MATERNITY 4.3X12.25 (PERSONAL CARE ITEMS) ×2 IMPLANT
PENCIL SMOKE EVAC W/HOLSTER (ELECTROSURGICAL) ×2 IMPLANT
RTRCTR C-SECT PINK 25CM LRG (MISCELLANEOUS) ×2 IMPLANT
SUT CHROMIC 1 CTX 36 (SUTURE) ×4 IMPLANT
SUT CHROMIC 2 0 CT 1 (SUTURE) ×2 IMPLANT
SUT PDS AB 0 CTX 60 (SUTURE) ×2 IMPLANT
SUT VIC AB 2-0 CT1 27 (SUTURE) ×1
SUT VIC AB 2-0 CT1 TAPERPNT 27 (SUTURE) ×1 IMPLANT
SUT VIC AB 4-0 KS 27 (SUTURE) IMPLANT
SYR 30ML LL (SYRINGE) IMPLANT
TOWEL OR 17X24 6PK STRL BLUE (TOWEL DISPOSABLE) ×2 IMPLANT
TRAY FOLEY W/BAG SLVR 14FR LF (SET/KITS/TRAYS/PACK) ×2 IMPLANT
WATER STERILE IRR 1000ML POUR (IV SOLUTION) ×2 IMPLANT

## 2020-05-05 NOTE — Anesthesia Procedure Notes (Signed)
Spinal  Patient location during procedure: OR Start time: 05/05/2020 12:01 PM End time: 05/05/2020 12:04 PM Staffing Performed: anesthesiologist  Anesthesiologist: [name], MD Preanesthetic Checklist Completed: patient identified, IV checked, risks and benefits discussed, monitors and equipment checked, pre-op evaluation and timeout performed Spinal Block Patient position: sitting Prep: DuraPrep and site prepped and draped Patient monitoring: heart rate, continuous pulse ox and blood pressure Approach: midline Location: L3-4 Injection technique: single-shot Needle Needle type: Pencan  Needle gauge: 24 G Needle length: 10 cm Assessment Sensory level: T4 Additional Notes Risks, benefits, and alternative discussed. Patient gave consent to procedure. Prepped and draped in sitting position. Clear CSF obtained after one needle pass. Positive terminal aspiration. No pain or paraesthesias with injection. Patient tolerated procedure well. Vital signs stable. Amalia Greenhouse, MD

## 2020-05-05 NOTE — Transfer of Care (Signed)
Immediate Anesthesia Transfer of Care Note  Patient: Terri Donaldson  Procedure(s) Performed: CESAREAN SECTION (N/A )  Patient Location: PACU  Anesthesia Type:Spinal  Level of Consciousness: awake, alert  and oriented  Airway & Oxygen Therapy: Patient Spontanous Breathing  Post-op Assessment: Report given to RN and Post -op Vital signs reviewed and stable  Post vital signs: Reviewed and stable  Last Vitals:  Vitals Value Taken Time  BP 142/97 05/05/20 1303  Temp    Pulse 65 05/05/20 1307  Resp 21 05/05/20 1307  SpO2 97 % 05/05/20 1307  Vitals shown include unvalidated device data.  Last Pain:  Vitals:   05/05/20 1303  TempSrc: (P) Oral         Complications: No complications documented.

## 2020-05-05 NOTE — Anesthesia Postprocedure Evaluation (Signed)
Anesthesia Post Note  Patient: Terri Donaldson  Procedure(s) Performed: CESAREAN SECTION (N/A )     Patient location during evaluation: PACU Anesthesia Type: Spinal Level of consciousness: awake and alert and oriented Pain management: pain level controlled Vital Signs Assessment: post-procedure vital signs reviewed and stable Respiratory status: spontaneous breathing, nonlabored ventilation and respiratory function stable Cardiovascular status: blood pressure returned to baseline Postop Assessment: no apparent nausea or vomiting, spinal receding, no headache and no backache Anesthetic complications: no   No complications documented.  Last Vitals:  Vitals:   05/05/20 1400 05/05/20 1413  BP: 115/83 124/80  Pulse: 66 57  Resp: 16 16  Temp:  (!) 36.3 C  SpO2: 97% 95%    Last Pain:  Vitals:   05/05/20 1413  TempSrc: Oral   Pain Goal:                Epidural/Spinal Function Cutaneous sensation: Tingles (05/05/20 1413), Patient able to flex knees: Yes (05/05/20 1413), Patient able to lift hips off bed: Yes (05/05/20 1413), Back pain beyond tenderness at insertion site: No (05/05/20 1413), Progressively worsening motor and/or sensory loss: No (05/05/20 1413), Bowel and/or bladder incontinence post epidural: No (05/05/20 1413)  [name]

## 2020-05-05 NOTE — H&P (Addendum)
Terri Donaldson is a 34 y.o. female presenting for repeat cesarean section  34 yo G2P1001 @ 39+2 presents for repeat cesarean section; She had a primary c/s for her first child due to macrosomia. She declines trial of labor. At 35 weeks a fetal ultrasound noted fetal ascites, [place] 1 week later at MFM showed complete resolution of ascites.  [place] for EFW on 7/6 showed EFW 4075 gm (9#) > 90% OB History    Gravida  2   Para  1   Term  1   Preterm      AB      Living  1     SAB      TAB      Ectopic      Multiple  0   Live Births  1          Past Medical History:  Diagnosis Date   Anemia    Past Surgical History:  Procedure Laterality Date   CESAREAN SECTION N/A 06/17/2018   Procedure: CESAREAN SECTION;  Surgeon: Waynard Reeds, MD;  Location: [facility] BIRTHING SUITES;  Service: Obstetrics;  Laterality: N/A;  RNFA IF POSSIBLE   FOOT SURGERY  2007   foot surgery     Family History: family history includes Breast cancer (age of onset: 80) in her maternal aunt; Hypertension in her father and maternal grandfather; Osteoporosis in her maternal aunt, maternal grandmother, and mother; Stroke (age of onset: 70) in her maternal grandfather; Thyroid cancer in her maternal aunt. Social History:  reports that she has never smoked. She has never used smokeless tobacco. She reports that she does not drink alcohol and does not use drugs.     Maternal Diabetes: No Genetic Screening: Normal Maternal Ultrasounds/Referrals: fetal ascites at 35 weeks, resolved at 36 weeks Fetal Ultrasounds or other Referrals:  None Maternal Substance Abuse:  No Significant Maternal Medications:  None Significant Maternal Lab Results:  None Other Comments:  None [place] for EFW on 7/6 showed EFW 4075 gm (9#) > 90%  Review of Systems History   Blood pressure 117/72, pulse 78, temperature 97.7 F (36.5 C), temperature source Oral, resp. rate 18, height 5\' 5"  (1.651 m), weight 71.2 kg, unknown if  currently breastfeeding. Exam Physical Exam  Prenatal labs: ABO, Rh: --/--/A POS (07/21 0855) Antibody: NEG (07/21 0855) Rubella: Immune (01/05 0000) RPR: NON REACTIVE (07/21 0855)  HBsAg: Negative (01/05 0000)  HIV: Non-reactive (01/05 0000)  GBS: Negative/-- (07/01 0000)   Assessment/Plan: 1) Admit 2) Ancef OCTOR 3) SCDS 7) Proceed with repeat cesarean section   [birth year]-19[birth year]2005 05/05/2020, 11:45 AM

## 2020-05-05 NOTE — Lactation Note (Signed)
This note was copied from a baby's chart. Lactation Consultation Note  Patient Name: Terri Donaldson [id number] Date: 05/05/2020 Reason for consult: Initial assessment;Term  P2 mother whose infant is now 8 hours old.  This is a term baby at 39+2 weeks.  Mother pumped and bottle fed her first child.  Baby awake and in father's arms when I arrived.  He fed approximately one hour ago.  Mother had no immediate questions/concerns related to breast feeding.  She informed me that her first baby had a tongue tie but this baby is feeding much better.  She denies pain with latching.  Encouraged to feed 8-12 times/24 hours or sooner as desired.  Mother will watch for feeding cues and continue hand expression before/after feeds to help increase milk supply.  Colostrum container provided and milk storage times discussed.  Finger feeding demonstrated.  Suggested mother call her RN/LC for latch assistance as needed.  Mom made aware of O/P services, breastfeeding support groups, community resources, and our phone # for post-discharge questions. Mother has a DEBP for home use.  Father present.   Maternal Data Formula Feeding for Exclusion: No Has patient been taught Hand Expression?: Yes Does the patient have breastfeeding experience prior to this delivery?: No (pumped and bottle fed baby)  Feeding Feeding Type: Breast Fed  LATCH Score Latch: Repeated attempts needed to sustain latch, nipple held in mouth throughout feeding, stimulation needed to elicit sucking reflex.  Audible Swallowing: Spontaneous and intermittent  Type of Nipple: Everted at rest and after stimulation  Comfort (Breast/Nipple): Soft / non-tender  Hold (Positioning): Assistance needed to correctly position infant at breast and maintain latch.  LATCH Score: 8  Interventions    Lactation Tools Discussed/Used     Consult Status Consult Status: Follow-up Date: 05/06/20 Follow-up type: In-patient    Dora Sims 05/05/2020, 4:35 PM

## 2020-05-05 NOTE — Op Note (Signed)
Pre-Operative Diagnosis: 1) 39+2-week intrauterine pregnancy 2) history of prior cesarean section, declines trial of labor 3) suspected macrosomia Postoperative Diagnosis: 1) 39+2-week intrauterine pregnancy 2) history of prior cesarean section, declines trial of labor 3) suspected macrosomia  Procedure: Repeat low transverse cesarean section Surgeon: Dr. Waynard Reeds Assistant: Dr. Carrington Clamp Operative Findings: Vigorous female infant in the vertex presentation with Apgar scores of 9 at 1 minute and 9 at 5 minutes.  Normal-appearing ovaries and tubes.  Weight pending at the time of this dictation Specimen: Placenta to labor and delivery for disposal EBL: Total I/O In: 2250 [I.V.:2250] Out: 316 [Urine:150; Blood:166]   Procedure:Terri Donaldson is an 34 year old gravida 2 para 1001 at 80 weeks and 2 days estimated gestational age who presents for cesarean section. Following the appropriate informed consent the patient was brought to the operating room where spinal anesthesia was administered and found to be adequate. She was placed in the dorsal supine position with a leftward tilt. She was prepped and draped in the normal sterile fashion. Scalpel was then used to make a Pfannenstiel skin incision which was carried down to the underlying layers of soft tissue to the fascia. The fascia was incised in the midline and the fascial incision was extended laterally with Mayo scissors. The superior aspect of the fascial incision was grasped with Coker clamps x2, tented up and the rectus muscles dissected off sharply with the electrocautery unit area and the same procedure was repeated on the inferior aspect of the fascial incision. The rectus muscles were separated in the midline. The abdominal peritoneum was identified, tented up, entered sharply, and the incision was extended superiorly and inferiorly with good visualization of the bladder. The Alexis retractor was then deployed. The vesicouterine  peritoneum was identified, tented up, entered sharply, and the bladder flap was created digitally. Scalpel was then used to make a low transverse incision on the uterus which was extended laterally with blunt dissection. The fetal vertex was identified, delivered easily through the uterine incision followed by the body. The infant was bulb suctioned on the operative field and cried vigorously, after a 1 minute delay in cord clamping the baby was passed to the waiting neonatology team. Placenta was then delivered spontaneously, the uterus was cleared of all clot and debris. The uterine incision was repaired with #1 chromic in running locked fashion followed by a second imbricating layer. Ovaries and tubes were inspected and normal.  The abdominal peritoneum was reapproximated with 2-0 Vicryl in a running fashion, the rectus muscles was reapproximated with 2-0 chromic in a running fashion. The fascia was closed with a looped PDS in a running fashion. The skin was closed with 4-0 vicryl in a subcuticular fashion and Dermabond. All sponge lap and needle counts were correct. Patient tolerated the procedure well and recovered in stable condition following the procedure.

## 2020-05-06 ENCOUNTER — Encounter (HOSPITAL_COMMUNITY): Payer: Self-pay | Admitting: Obstetrics and Gynecology

## 2020-05-06 ENCOUNTER — Encounter: Payer: Self-pay | Admitting: Family Medicine

## 2020-05-06 LAB — CBC
HCT: 31.8 % — ABNORMAL LOW (ref 36.0–46.0)
Hemoglobin: 10.2 g/dL — ABNORMAL LOW (ref 12.0–15.0)
MCH: 28.3 pg (ref 26.0–34.0)
MCHC: 32.1 g/dL (ref 30.0–36.0)
MCV: 88.1 fL (ref 80.0–100.0)
Platelets: 144 10*3/uL — ABNORMAL LOW (ref 150–400)
RBC: 3.61 MIL/uL — ABNORMAL LOW (ref 3.87–5.11)
RDW: 14.5 % (ref 11.5–15.5)
WBC: 12.1 10*3/uL — ABNORMAL HIGH (ref 4.0–10.5)
nRBC: 0 % (ref 0.0–0.2)

## 2020-05-06 MED ORDER — IBUPROFEN 800 MG PO TABS: 800.0000 mg | ORAL_TABLET | Freq: Three times a day (TID) | ORAL | 0 refills | Status: DC

## 2020-05-06 MED ORDER — OXYCODONE-ACETAMINOPHEN 5-325 MG PO TABS: 1.0000 | ORAL_TABLET | ORAL | 0 refills | Status: DC | PRN

## 2020-05-06 NOTE — Progress Notes (Signed)
°  Patient is eating, ambulating, voiding.  Pain control is good.  Vitals:   05/05/20 1817 05/05/20 2215 05/06/20 0215 05/06/20 0614  BP:  105/65 108/70 104/69  Pulse:  64 64 59  Resp:  18 18 18   Temp:  98.3 F (36.8 C) 98.5 F (36.9 C) 97.8 F (36.6 C)  TempSrc:  Oral Oral Oral  SpO2: 94% 96% 95% 96%  Weight:      Height:        lungs:   clear to auscultation cor:    RRR Abdomen:  soft, appropriate tenderness, incisions intact and without erythema or exudate ex:    no cords   Lab Results  Component Value Date   WBC 12.1 (H) 05/06/2020   HGB 10.2 (L) 05/06/2020   HCT 31.8 (L) 05/06/2020   MCV 88.1 05/06/2020   PLT 144 (L) 05/06/2020    --/--/A POS (07/21 0855)/RI  A/P    Post operative day 1.  Routine post op and postpartum care.  Expect d/c tomorrow.  Percocet for pain control.

## 2020-05-06 NOTE — Discharge Summary (Signed)
Postpartum Discharge Summary  Date of Service updated      Patient Name: Terri Donaldson DOB: 10/11/1986 MRN: [id number]  Date of admission: 05/05/2020 Delivery date:05/05/2020  Delivering provider: Vanessa Kick  Date of discharge: 05/06/2020  Admitting diagnosis: Cesarean delivery, delivered, current hospitalization [O82] Encounter for maternal care for low transverse scar from repeat cesarean delivery [O34.211] Intrauterine pregnancy: [redacted]w[redacted]d    Secondary diagnosis:  Active Problems:   Cesarean delivery, delivered, current hospitalization   Encounter for maternal care for low transverse scar from repeat cesarean delivery  Additional problems: none    Discharge diagnosis: Term Pregnancy Delivered                                              Post partum procedures:none Augmentation: N/A Complications: None  Hospital course: Sceduled C/S   34y.o. yo G2P2002 at 360w2das admitted to the hospital 05/05/2020 for scheduled cesarean section with the following indication:Elective Repeat.Delivery details are as follows:  Membrane Rupture Time/Date: 12:23 PM ,05/05/2020   Delivery Method:C-Section, Low Transverse  Details of operation can be found in separate operative note.  Patient had an uncomplicated postpartum course.  She is ambulating, tolerating a regular diet, passing flatus, and urinating well. Patient is discharged home in stable condition on  05/06/20        Newborn Data: Birth date:05/05/2020  Birth time:12:23 PM  Gender:Female  Living status:Living  Apgars:9 ,9  Weight:4315 g     Magnesium Sulfate received: No BMZ received: No Rhophylac:No MMR:No T-DaP:Given prenatally Flu: No Transfusion:No  Physical exam  Vitals:   05/05/20 2215 05/06/20 0215 05/06/20 0614 05/06/20 1400  BP: 105/65 108/70 104/69 (!) 109/59  Pulse: 64 64 59 72  Resp: '18 18 18 19  ' Temp: 98.3 F (36.8 C) 98.5 F (36.9 C) 97.8 F (36.6 C) 98.3 F (36.8 C)  TempSrc: Oral Oral Oral  Oral  SpO2: 96% 95% 96%   Weight:      Height:        Labs: Lab Results  Component Value Date   WBC 12.1 (H) 05/06/2020   HGB 10.2 (L) 05/06/2020   HCT 31.8 (L) 05/06/2020   MCV 88.1 05/06/2020   PLT 144 (L) 05/06/2020   CMP Latest Ref Rng & Units 10/26/2018  Glucose 70 - 99 mg/dL 61(L)  BUN 6 - 23 mg/dL 22  Creatinine 0.40 - 1.20 mg/dL 0.78  Sodium 135 - 145 mEq/L 139  Potassium 3.5 - 5.1 mEq/L 4.1  Chloride 96 - 112 mEq/L 104  CO2 19 - 32 mEq/L 26  Calcium 8.4 - 10.5 mg/dL 9.6  Total Protein 6.0 - 8.3 g/dL 7.1  Total Bilirubin 0.2 - 1.2 mg/dL 0.7  Alkaline Phos 39 - 117 U/L 77  AST 0 - 37 U/L 16  ALT 0 - 35 U/L 13   Edinburgh Score: Edinburgh Postnatal Depression Scale Screening Tool 05/06/2020  I have been able to laugh and see the funny side of things. 0  I have looked forward with enjoyment to things. 0  I have blamed myself unnecessarily when things went wrong. 0  I have been anxious or worried for no good reason. 1  I have felt scared or panicky for no good reason. 0  Things have been getting on top of me. 0  I have been so unhappy that I have  had difficulty sleeping. 0  I have felt sad or miserable. 0  I have been so unhappy that I have been crying. 0  The thought of harming myself has occurred to me. 0  Edinburgh Postnatal Depression Scale Total 1      After visit meds:  Allergies as of 05/06/2020   No Known Allergies     Medication List    TAKE these medications   ferrous sulfate 325 (65 FE) MG tablet Take 325 mg by mouth daily with breakfast.   ibuprofen 800 MG tablet Commonly known as: ADVIL Take 1 tablet (800 mg total) by mouth every 8 (eight) hours.   oxyCODONE-acetaminophen 5-325 MG tablet Commonly known as: PERCOCET/ROXICET Take 1 tablet by mouth every 4 (four) hours as needed for severe pain.   prenatal multivitamin Tabs tablet Take 1 tablet by mouth daily.        Discharge home in stable condition Infant Feeding: ? Infant  Disposition:home with mother Discharge instruction: per After Visit Summary and Postpartum booklet. Activity: Advance as tolerated. Pelvic rest for 6 weeks.  Diet: routine diet Anticipated Birth Control: Unsure Postpartum Appointment:4 weeks Additional Postpartum F/U: None Future Appointments:No future appointments. Follow up Visit:  Follow-up Information    Vanessa Kick, MD Follow up in 4 week(s).   Specialty: Obstetrics and Gynecology Contact information: [address] [place] [address] [phone]                   05/06/2020 Daria Pastures, MD

## 2020-05-07 NOTE — Progress Notes (Signed)
  Patient is eating, ambulating, voiding.  Pain control is good.  Vitals:   05/06/20 0614 05/06/20 1400 05/06/20 1920 05/07/20 0526  BP: 104/69 (!) 109/59 117/70 118/68  Pulse: 59 72 70 68  Resp: 18 19 17 16   Temp: 97.8 F (36.6 C) 98.3 F (36.8 C) 98 F (36.7 C) 98.1 F (36.7 C)  TempSrc: Oral Oral Oral Oral  SpO2: 96%  96% 100%  Weight:      Height:        lungs:   clear to auscultation cor:    RRR Abdomen:  soft, appropriate tenderness, incisions intact and without erythema or exudate ex:    no cords   Lab Results  Component Value Date   WBC 12.1 (H) 05/06/2020   HGB 10.2 (L) 05/06/2020   HCT 31.8 (L) 05/06/2020   MCV 88.1 05/06/2020   PLT 144 (L) 05/06/2020    --/--/A POS (07/21 0855)/RI  A/P    Post operative day 2.  Routine post op and postpartum care.  Expect d/c today.  Percocet for pain control.

## 2020-05-07 NOTE — Lactation Note (Signed)
This note was copied from a baby's chart. Lactation Consultation Note  Patient Name: Terri Donaldson [id number] Date: 05/07/2020 Reason for consult: Follow-up assessment   Mother is a P2, infant is 33 hours old .  Mother reports that infant is feeding well.  Mother reports that her nipples are slightly sore. She reports that she did have a blister on the left nipple but it has healed now. Mother also reports that she had Mastitis with the first child.  Mother is unsure how she got Mastitis. She denies having any cracks . She was exclusively pumping at the time.  Discussed S/S of Mastitis. Mother encouraged to keep breast drained well. Mother advised to post pump for comfort.  if still very full after infant feeds.    Discussed treatment and prevention of engorgement.   Mother to continue to cue base feed infant and feed at least 8-12 times or more in 24 hours and advised to allow for cluster feeding infant as needed.  Mother to continue to due STS. Mother is aware of available LC services at [facility], BFSG'S, OP Dept, and phone # for questions or concerns about breastfeeding.  Mother receptive to all teaching and plan of care.     Maternal Data    Feeding    LATCH Score                   Interventions Interventions: Position options  Lactation Tools Discussed/Used     Consult Status Consult Status: Complete    Michel Bickers 05/07/2020, 10:51 AM

## 2020-08-04 ENCOUNTER — Ambulatory Visit (INDEPENDENT_AMBULATORY_CARE_PROVIDER_SITE_OTHER): Payer: No Typology Code available for payment source | Admitting: Family Medicine

## 2020-08-04 ENCOUNTER — Encounter: Payer: Self-pay | Admitting: Family Medicine

## 2020-08-04 ENCOUNTER — Other Ambulatory Visit: Payer: Self-pay

## 2020-08-04 VITALS — BP 92/60 | HR 71 | Temp 97.7°F | Ht 65.0 in | Wt 132.0 lb

## 2020-08-04 DIAGNOSIS — B351 Tinea unguium: Secondary | ICD-10-CM

## 2020-08-04 DIAGNOSIS — L84 Corns and callosities: Secondary | ICD-10-CM

## 2020-08-04 NOTE — Progress Notes (Signed)
Terri Donaldson DOB: 27-Apr-1986 Encounter date: 08/04/2020  This is a 34 y.o. female who presents with Chief Complaint  Patient presents with  . Nail Problem    patient complains of left 4th and 5th toenail thickening and hard area plantar aspect of the right foot    History of present illness:  Has 87 month old and 34 year old now at home. Still on maternity leave. Husband goes back to work next week. They are in a good routine. She is feeling good overall.   4th and 5th toes left foot thickened, yellow. She treated 5th toenail awhile ago with topical treatment and it resolved.   also had some thickened callous bottom of right foot; felt better with scraping this off.   No Known Allergies Current Meds  Medication Sig  . Prenatal Vit-Fe Fumarate-FA (PRENATAL MULTIVITAMIN) TABS tablet Take 1 tablet by mouth daily.     Review of Systems  Constitutional: Negative for chills, fatigue and fever.  Respiratory: Negative for cough, chest tightness, shortness of breath and wheezing.   Cardiovascular: Negative for chest pain, palpitations and leg swelling.    Objective:  BP 92/60 (BP Location: Left Arm, Patient Position: Sitting, Cuff Size: Normal)   Pulse 71   Temp 97.7 F (36.5 C) (Oral)   Ht 5\' 5"  (1.651 m)   Wt 132 lb (59.9 kg)   Breastfeeding Yes   BMI 21.97 kg/m   Weight: 132 lb (59.9 kg)   BP Readings from Last 3 Encounters:  08/04/20 92/60  05/07/20 118/68  10/23/18 110/60   Wt Readings from Last 3 Encounters:  08/04/20 132 lb (59.9 kg)  05/05/20 157 lb (71.2 kg)  04/20/20 155 lb (70.3 kg)    Physical Exam Constitutional:      General: She is not in acute distress.    Appearance: She is well-developed.  Cardiovascular:     Rate and Rhythm: Normal rate and regular rhythm.     Heart sounds: Normal heart sounds. No murmur heard.  No friction rub.  Pulmonary:     Effort: Pulmonary effort is normal. No respiratory distress.     Breath sounds: Normal  breath sounds. No wheezing or rales.  Musculoskeletal:     Right lower leg: No edema.     Left lower leg: No edema.  Skin:    Comments: Thickened, yellow toenails fourth and fifth digits left foot.  Callused lesion with central divot right ball of foot.  Neurological:     Mental Status: She is alert and oriented to person, place, and time.  Psychiatric:        Behavior: Behavior normal.   Corn removal procedure: After consent was contained, right foot was cleansed with alcohol and using a dermal curette, thickened area of callus on ball of foot between second and third metatarsal was shaved.  There is significant callus and central corn was removed.  Patient tolerated procedure well and had immediate improvement in discomfort foot.  Assessment/Plan  1. Corn of foot This was resolved in the office today.  Encourage patient to wear supportive shoes, and can follow-up with podiatry for recheck if any further issues.- Ambulatory referral to Podiatry  2. Onychomycosis She is breast-feeding, so would prefer to avoid antifungals orally if possible.  She has been topical treatments in the past and had success with this, but I think it is worthwhile to send podiatry to see if there is any more efficient treatment available for her. - Ambulatory referral to Podiatry  Return in about 3 months (around 11/04/2020) for physical exam.    Theodis Shove, MD

## 2020-08-24 ENCOUNTER — Other Ambulatory Visit: Payer: Self-pay

## 2020-08-24 ENCOUNTER — Encounter: Payer: Self-pay | Admitting: Family Medicine

## 2020-08-24 ENCOUNTER — Ambulatory Visit (INDEPENDENT_AMBULATORY_CARE_PROVIDER_SITE_OTHER): Payer: No Typology Code available for payment source | Admitting: Podiatry

## 2020-08-24 DIAGNOSIS — B351 Tinea unguium: Secondary | ICD-10-CM | POA: Diagnosis not present

## 2020-08-24 DIAGNOSIS — M79675 Pain in left toe(s): Secondary | ICD-10-CM

## 2020-08-24 DIAGNOSIS — L84 Corns and callosities: Secondary | ICD-10-CM

## 2020-08-24 MED ORDER — CICLOPIROX 8 % EX SOLN: Freq: Every day | CUTANEOUS | 2 refills | Status: DC

## 2020-08-29 NOTE — Progress Notes (Signed)
Subjective:   Patient ID: Terri Donaldson, female   DOB: 34 y.o.   MRN: [id number]   HPI 34 year old female presents the office with concerns of her left fourth and fifth nails become thickened and growing upwards.  She states occasionally cause discomfort but denies any redness or drainage or any swelling to the toenail site.  She also had a corn on the fifth toe that was treated by her primary care physician.  No ulcerations or she reports denies any swelling or redness.  She has no other concerns today.  Review of Systems  All other systems reviewed and are negative.  Past Medical History:  Diagnosis Date  . Anemia     Past Surgical History:  Procedure Laterality Date  . CESAREAN SECTION N/A 06/17/2018   Procedure: CESAREAN SECTION;  Surgeon: Waynard Reeds, MD;  Location: [facility] [place] BIRTHING SUITES;  Service: Obstetrics;  Laterality: N/A;  RNFA IF POSSIBLE  . CESAREAN SECTION N/A 05/05/2020   Procedure: CESAREAN SECTION;  Surgeon: Waynard Reeds, MD;  Location: [facility] LD ORS;  Service: Obstetrics;  Laterality: N/A;  . FOOT SURGERY  2007  . foot surgery       Current Outpatient Medications:  .  ciclopirox (PENLAC) 8 % solution, Apply topically at bedtime. Apply over nail and surrounding skin. Apply daily over previous coat. After seven (7) days, may remove with alcohol and continue cycle., Disp: 6.6 mL, Rfl: 2 .  norethindrone (MICRONOR) 0.35 MG tablet, Take 1 tablet by mouth daily., Disp: , Rfl:  .  Prenatal Vit-Fe Fumarate-FA (PRENATAL MULTIVITAMIN) TABS tablet, Take 1 tablet by mouth daily. , Disp: , Rfl:  .  triamcinolone cream (KENALOG) 0.1 %, triamcinolone acetonide 0.1 % topical cream, Disp: , Rfl:   Allergies  Allergen Reactions  . No Known Allergies        Objective:  Physical Exam  General: AAO x3, NAD  Dermatological: Left fourth and fifth digit toenails hypertrophic, dystrophic with yellow-brown discoloration.  No hyperpigmented changes.  No edema, erythema or signs of  infection.  Minimal callus on the fifth toe PIPJ.  No ulcerations.  There are no open sores, no preulcerative lesions, no rash or signs of infection present.  Vascular: Dorsalis Pedis artery and Posterior Tibial artery pedal pulses are 2/4 bilateral with immedate capillary fill time. There is no pain with calf compression, swelling, warmth, erythema.   Neruologic: Grossly intact via light touch bilateral.   Musculoskeletal: Adductovarus present fifth toes.  Muscular strength 5/5 in all groups tested bilateral.  Gait: Unassisted, Nonantalgic.       Assessment:   34 year old female with onychomycosis, hyperkeratotic lesion    Plan:  -Treatment options discussed including all alternatives, risks, and complications -Etiology of symptoms were discussed -Discussed treatment options for nail fungus.  After discussion she wants to proceed with topical treatment.  Prescribed Penlac.  Discussed side effects, success rates. -Offloading pad for the fifth toe.  Discussed shoe modifications as well.  Vivi Barrack DPM

## 2020-10-13 ENCOUNTER — Telehealth: Payer: No Typology Code available for payment source | Admitting: Nurse Practitioner

## 2020-10-13 DIAGNOSIS — N3000 Acute cystitis without hematuria: Secondary | ICD-10-CM

## 2020-10-13 MED ORDER — NITROFURANTOIN MONOHYD MACRO 100 MG PO CAPS
100.0000 mg | ORAL_CAPSULE | Freq: Two times a day (BID) | ORAL | 0 refills | Status: DC
Start: 2020-10-13 — End: 2021-05-11

## 2020-10-13 NOTE — Progress Notes (Signed)

## 2020-11-08 ENCOUNTER — Encounter: Payer: No Typology Code available for payment source | Admitting: Family Medicine

## 2020-11-09 ENCOUNTER — Encounter: Payer: Self-pay | Admitting: Family Medicine

## 2021-01-11 IMAGING — US US MFM OB DETAIL+14 WK
1 series · 12 of 28 positions shown · non-contrast
Comparison: none

[Series 1: us mfm ob detail+14 wk · 79 acquisitions, 12 frames shown]
[im 3/79]
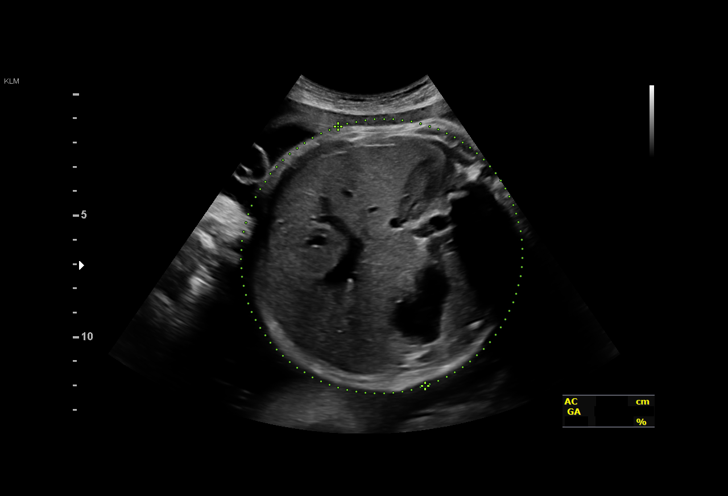
[im 9/79]
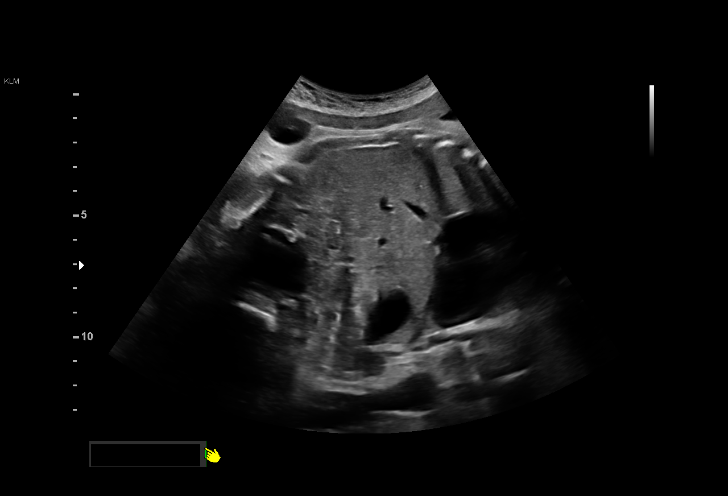
[im 15/79]
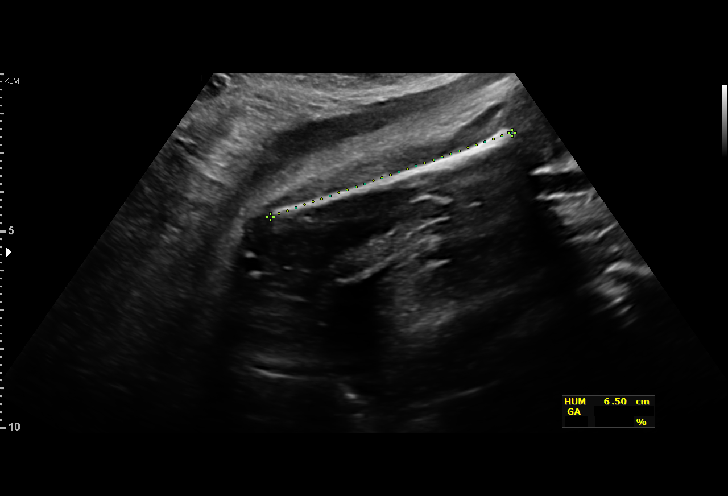
[im 24/79]
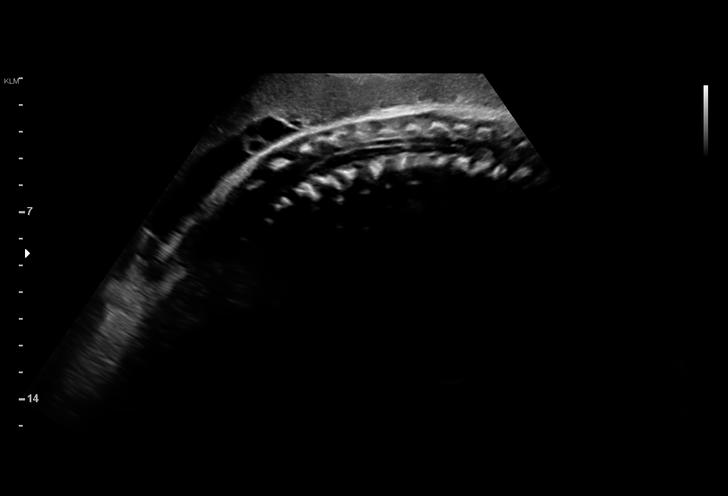
[im 29/79]
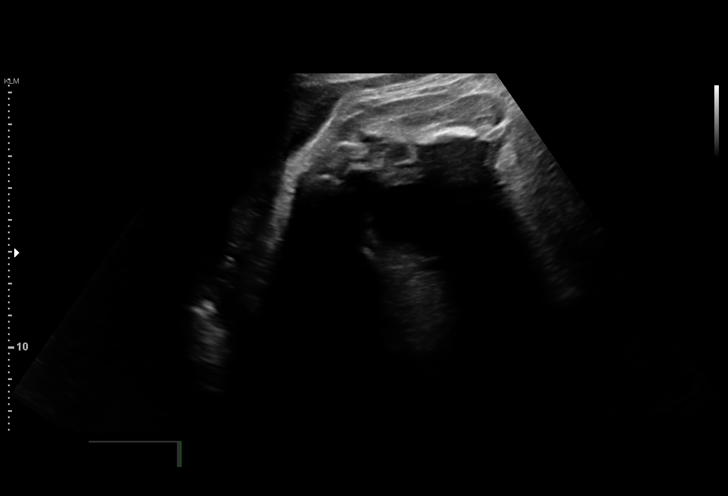
[im 35/79]
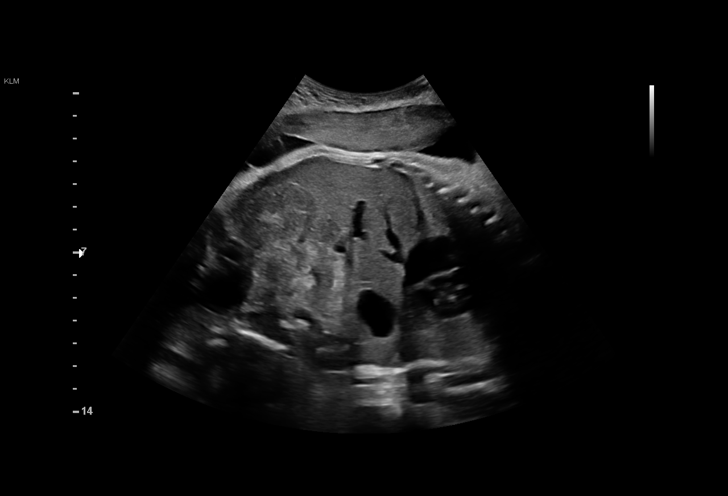
[im 44/79]
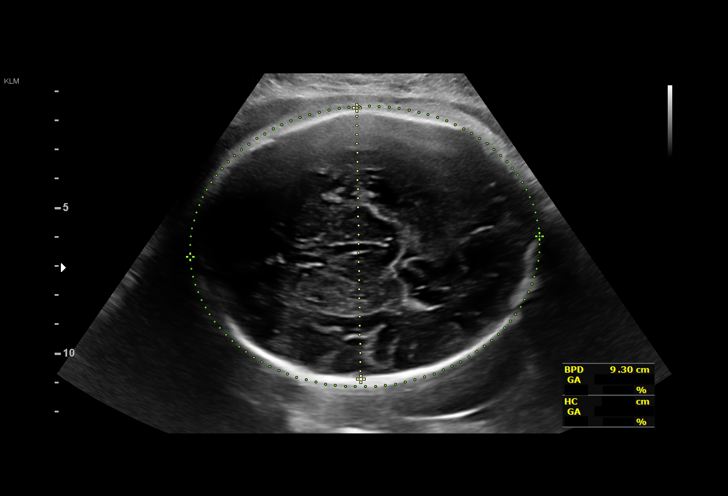
[im 50/79]
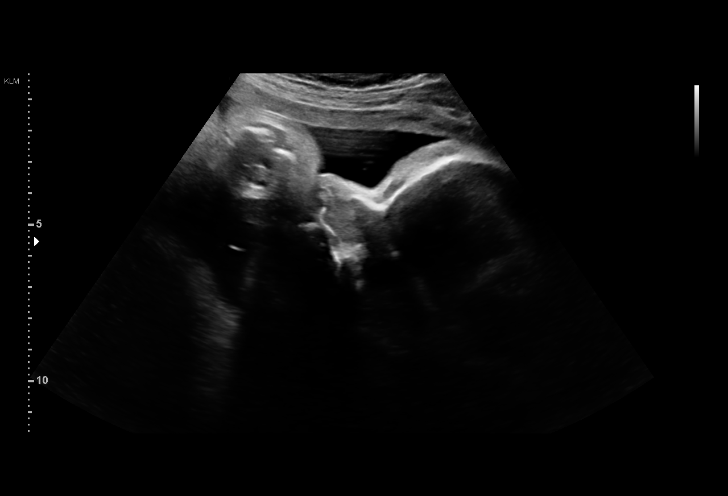
[im 55/79]
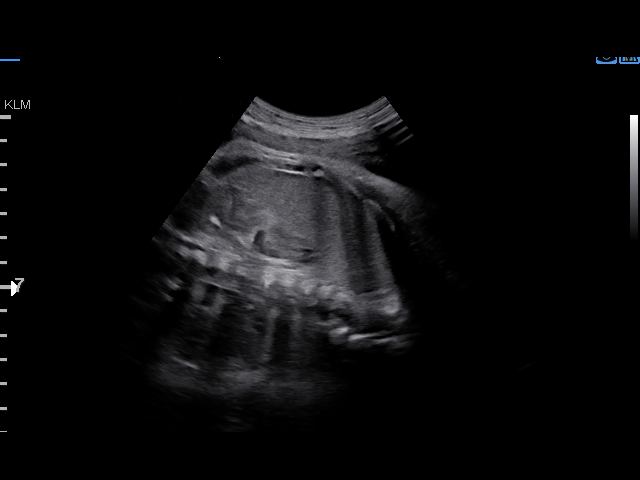
[im 64/79]
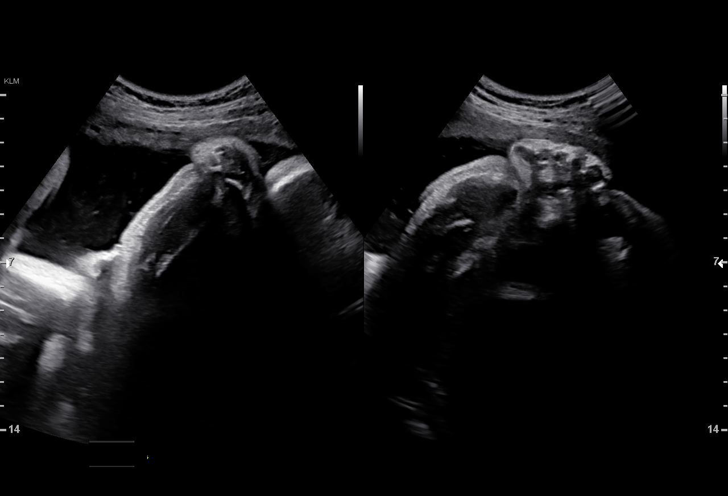
[im 70/79]
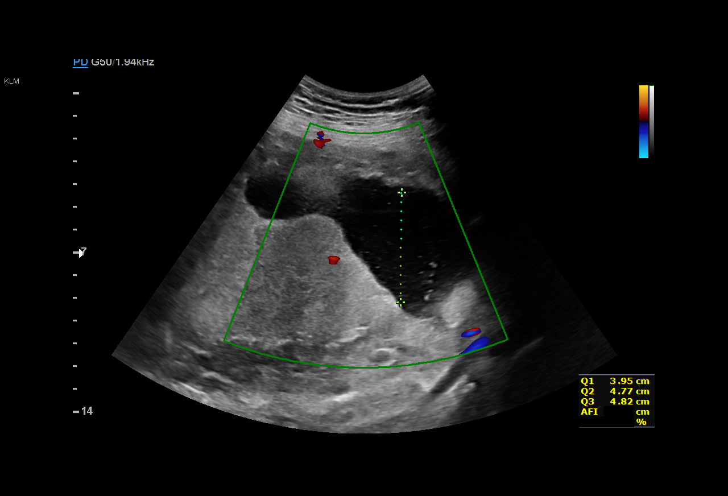
[im 76/79]
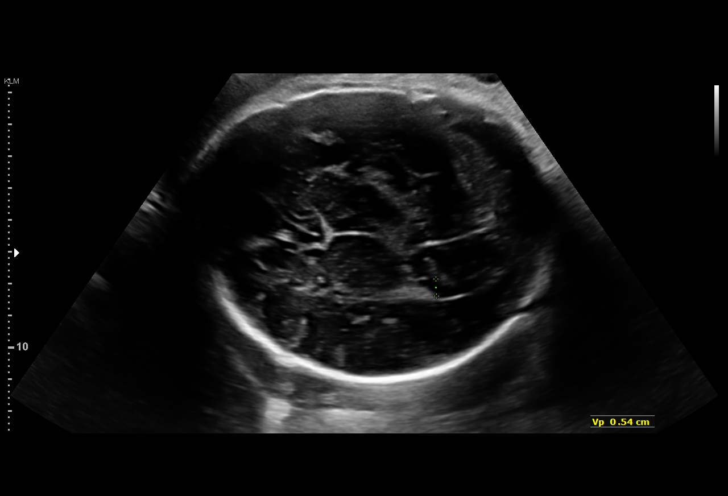

[12 of 28 positions shown; findings below may reference images not displayed]

[REDACTED]

                                                      TIGER
    STRESS                                            SALII
 3  US MFM MCA DOPPLER                    76821.01    SALII
                                                      TATE GERMAN IGNATIUS

Indications

 Fetal abnormality - other known or
 suspected (liver ascites)
 35 weeks gestation of pregnancy
 Encounter for antenatal screening for
 malformations (low risk NIPS)
 Previous cesarean delivery, antepartum
Fetal Evaluation

 Num Of Fetuses:         1
 Fetal Heart Rate(bpm):  150
 Cardiac Activity:       Observed
 Presentation:           Cephalic
 Placenta:               Posterior
 P. Cord Insertion:      Visualized

 Amniotic Fluid
 AFI FV:      Within normal limits

 AFI Sum(cm)     %Tile       Largest Pocket(cm)
 15.2            55

 RUQ(cm)       RLQ(cm)       LUQ(cm)        LLQ(cm)
 4
Biophysical Evaluation

 Amniotic F.V:   Within normal limits       F. Tone:        Observed
 F. Movement:    Observed                   Score:          [DATE]
 F. Breathing:   Observed
Biometry

 BPD:      92.3  mm     G. Age:  37w 3d         93  %    CI:        73.86   %    70 - 86
                                                         FL/HC:      20.6   %    20.1 -
 HC:      341.1  mm     G. Age:  39w 2d         93  %    HC/AC:      0.96        0.93 -
 AC:      356.9  mm     G. Age:  39w 4d       > 99  %    FL/BPD:     75.9   %    71 - 87
 FL:       70.1  mm     G. Age:  36w 0d         51  %    FL/AC:      19.6   %    20 - 24
 HUM:      65.6  mm     G. Age:  38w 0d       > 95  %

 Est. FW:    0789  gm    7 lb 12 oz      99  %
OB History

 Gravidity:    2         Term:   1        Prem:   0        SAB:   0
 TOP:          0       Ectopic:  0        Living: 1
Gestational Age

 Clinical EDD:  35w 5d                                        EDD:   05/10/20
 U/S Today:     38w 1d                                        EDD:   04/23/20
 Best:          35w 5d     Det. By:  Clinical EDD             EDD:   05/10/20
Anatomy

 Cranium:               Appears normal         LVOT:                   Appears normal
 Cavum:                 Appears normal         Aortic Arch:            Appears normal
 Ventricles:            Appears normal         Ductal Arch:            Not well visualized
 Choroid Plexus:        Appears normal         Diaphragm:              Appears normal
 Cerebellum:            Appears normal         Stomach:                Appears normal, left
                                                                       sided
 Posterior Fossa:       Not well visualized    Abdomen:                Appears normal
 Nuchal Fold:           Not applicable (>20    Abdominal Wall:         Not well visualized
                        wks GA)
 Face:                  Appears normal         Cord Vessels:           Appears normal (3
                        (orbits and profile)                           vessel cord)
 Lips:                  Appears normal         Kidneys:                Appear normal
 Palate:                Not well visualized    Bladder:                Appears normal
 Thoracic:              Appears normal         Spine:                  Appears normal
 Heart:                 Not well visualized    Upper Extremities:      Appears normal
 RVOT:                  Appears normal         Lower Extremities:      Appears normal

 Other:  Technically difficult due to advanced gestational age.
Doppler - Fetal Vessels

 Middle Cerebral Artery
                                                      PSV   MoM
                                                    (cm/s)


Cervix Uterus Adnexa
 Cervix
 Not visualized (advanced GA >16wks)
Impression

 Ms. SALII2 P1 at 35w 5d gestation is here for a
 second opinion.  On your office ultrasound performed on
 04/06/2020, moderate fetal ascites was seen.
 Her pregnancy has been uneventful so far.  On cell free fetal
 DNA screening, the risks of fetal aneuploidies are not
 increased.  Patient does not have gestational diabetes.  Her
 blood pressures have been normal at prenatal visits.
 She does not give history of fever or rashes in this
 pregnancy.  She had Covid vaccine (2 doses).

 Obstetric history is significant for a term cesarean delivery
 (fetal macrosomia) in 3819 of a male infant weighing 9
 pounds 14 ounces at birth.

 On today's ultrasound, amniotic fluid is normal and good fetal
 activity is seen. The estimated fetal weight is at the 99th
 percentile.  Intracranial structures appear normal with no
 evidence of calcifications.  There is no evidence of fetal
 ascites or pleural effusions or hydrops.  Liver appears normal
 with no calcifications.  Rest of the fetal anatomy appears
 normal even though limited by advanced gestational age.
 Antenatal testing is reassuring.  BPP [DATE].  Middle cerebral
 artery Doppler was performed because of the finding of fetal
 ascites at your office (rule out fetal anemia).  Middle cerebral
 artery peak systolic velocity measurement is within normal
 limits (no evidence of anemia).
 Placenta is posterior and there is no evidence of previa or
 accreta.

 I have reassured the couple that ascites was not seen today.
 I also informed that fetal ascites can spontaneously resolve in
 some cases.  Based on her history and findings I do not
 suspect fetal infection or anemia.

 I have recommend a follow-up ultrasound at your office in 1
 to 2 weeks.  Please contact MFM if fetal ascites is seen again.
Recommendations

 Follow-up scans may be performed at your office.
                      Jumper, Blain

## 2021-03-28 ENCOUNTER — Encounter: Payer: Self-pay | Admitting: Family Medicine

## 2021-05-10 ENCOUNTER — Other Ambulatory Visit: Payer: Self-pay

## 2021-05-11 ENCOUNTER — Encounter: Payer: Self-pay | Admitting: Family Medicine

## 2021-05-11 ENCOUNTER — Ambulatory Visit: Payer: No Typology Code available for payment source | Admitting: Family Medicine

## 2021-05-11 VITALS — BP 118/80 | HR 84 | Temp 97.7°F | Ht 65.0 in | Wt 135.2 lb

## 2021-05-11 DIAGNOSIS — Z131 Encounter for screening for diabetes mellitus: Secondary | ICD-10-CM | POA: Diagnosis not present

## 2021-05-11 DIAGNOSIS — B351 Tinea unguium: Secondary | ICD-10-CM | POA: Diagnosis not present

## 2021-05-11 DIAGNOSIS — Z13220 Encounter for screening for lipoid disorders: Secondary | ICD-10-CM | POA: Diagnosis not present

## 2021-05-11 LAB — LIPID PANEL
Cholesterol: 130 mg/dL (ref <200)
HDL: 35 mg/dL — ABNORMAL LOW (ref >=50)
LDL Cholesterol (Calc): 72 mg/dL (calc)
Non-HDL Cholesterol (Calc): 95 mg/dL (calc) (ref <130)
Total CHOL/HDL Ratio: 3.7 (calc) (ref <5.0)
Triglycerides: 146 mg/dL (ref <150)

## 2021-05-11 LAB — COMPREHENSIVE METABOLIC PANEL
AG Ratio: 1.5 (calc) (ref 1.0–2.5)
ALT: 11 U/L (ref 6–29)
AST: 15 U/L (ref 10–30)
Albumin: 4.3 g/dL (ref 3.6–5.1)
Alkaline phosphatase (APISO): 84 U/L (ref 31–125)
BUN: 14 mg/dL (ref 7–25)
CO2: 28 mmol/L (ref 20–32)
Calcium: 9.4 mg/dL (ref 8.6–10.2)
Chloride: 102 mmol/L (ref 98–110)
Creat: 0.66 mg/dL (ref 0.50–0.97)
Globulin: 2.9 g/dL (calc) (ref 1.9–3.7)
Glucose, Bld: 98 mg/dL (ref 65–99)
Potassium: 4.3 mmol/L (ref 3.5–5.3)
Sodium: 138 mmol/L (ref 135–146)
Total Bilirubin: 0.3 mg/dL (ref 0.2–1.2)
Total Protein: 7.2 g/dL (ref 6.1–8.1)

## 2021-05-11 MED ORDER — TERBINAFINE HCL 250 MG PO TABS: 250.0000 mg | ORAL_TABLET | Freq: Every day | ORAL | 0 refills | Status: AC

## 2021-05-11 NOTE — Progress Notes (Signed)
  Terri Donaldson DOB: 1986-02-04 Encounter date: 05/11/2021  This is a 35 y.o. female who presents with Chief Complaint  Patient presents with   Nail Problem    Patient complains of nail thickening left 4th and 5th digits x1 year    History of present illness:   Allergies  Allergen Reactions   No Known Allergies    Current Meds  Medication Sig   levonorgestrel (MIRENA) 20 MCG/DAY IUD 1 each by Intrauterine route once.   terbinafine (LAMISIL) 250 MG tablet Take 1 tablet (250 mg total) by mouth daily.   [DISCONTINUED] ciclopirox (PENLAC) 8 % solution Apply topically at bedtime. Apply over nail and surrounding skin. Apply daily over previous coat. After seven (7) days, may remove with alcohol and continue cycle.    Review of Systems  Constitutional:  Negative for chills, fatigue and fever.  Respiratory:  Negative for cough, chest tightness, shortness of breath and wheezing.   Cardiovascular:  Negative for chest pain, palpitations and leg swelling.  Skin:        4th,5th digits left foot. No improvement with topical tx.    Objective:  BP 118/80 (BP Location: Left Arm, Patient Position: Sitting, Cuff Size: Normal)   Pulse 84   Temp 97.7 F (36.5 C) (Oral)   Ht 5\' 5"  (1.651 m)   Wt 135 lb 3.2 oz (61.3 kg)   Breastfeeding No   BMI 22.50 kg/m   Weight: 135 lb 3.2 oz (61.3 kg)   BP Readings from Last 3 Encounters:  05/11/21 118/80  08/04/20 92/60  05/07/20 118/68   Wt Readings from Last 3 Encounters:  05/11/21 135 lb 3.2 oz (61.3 kg)  08/04/20 132 lb (59.9 kg)  05/05/20 157 lb (71.2 kg)    Physical Exam Constitutional:      General: She is not in acute distress.    Appearance: She is well-developed.  Cardiovascular:     Rate and Rhythm: Normal rate and regular rhythm.     Heart sounds: Normal heart sounds. No murmur heard.   No friction rub.  Pulmonary:     Effort: Pulmonary effort is normal. No respiratory distress.     Breath sounds: Normal breath  sounds. No wheezing or rales.  Musculoskeletal:     Right lower leg: No edema.     Left lower leg: No edema.  Neurological:     Mental Status: She is alert and oriented to person, place, and time.  Psychiatric:        Behavior: Behavior normal.     Assessment/Plan  1. Onychomycosis Failed topical tx. Will try lamisil. Discussed new medication(s) today with patient. Discussed potential side effects and patient verbalized understanding. We will get baseline live function today and repeat in 1 mo.   2. Lipid screening  - Lipid panel; Future - Lipid panel  3. Screening for diabetes mellitus - Comprehensive metabolic panel; Future - Comprehensive metabolic panel   Return if symptoms worsen or fail to improve.     05/07/20, MD

## 2021-05-14 ENCOUNTER — Encounter: Payer: Self-pay | Admitting: Family Medicine

## 2021-05-14 ENCOUNTER — Other Ambulatory Visit: Payer: Self-pay | Admitting: Family Medicine

## 2021-05-14 DIAGNOSIS — Z79899 Other long term (current) drug therapy: Secondary | ICD-10-CM

## 2021-05-15 ENCOUNTER — Telehealth: Payer: Self-pay

## 2021-05-15 NOTE — Telephone Encounter (Signed)
See My chart message

## 2021-05-15 NOTE — Telephone Encounter (Signed)
PT called to return the missed call 

## 2021-05-15 NOTE — Telephone Encounter (Signed)
See results note. 

## 2021-06-21 ENCOUNTER — Other Ambulatory Visit: Payer: Self-pay | Admitting: *Deleted

## 2021-06-21 ENCOUNTER — Other Ambulatory Visit: Payer: Self-pay | Admitting: Family Medicine

## 2021-06-21 DIAGNOSIS — Z79899 Other long term (current) drug therapy: Secondary | ICD-10-CM

## 2021-06-22 ENCOUNTER — Other Ambulatory Visit (INDEPENDENT_AMBULATORY_CARE_PROVIDER_SITE_OTHER): Payer: No Typology Code available for payment source

## 2021-06-22 ENCOUNTER — Other Ambulatory Visit: Payer: Self-pay

## 2021-06-22 DIAGNOSIS — Z79899 Other long term (current) drug therapy: Secondary | ICD-10-CM | POA: Diagnosis not present

## 2021-06-22 LAB — COMPREHENSIVE METABOLIC PANEL
AG Ratio: 1.7 (calc) (ref 1.0–2.5)
ALT: 7 U/L (ref 6–29)
AST: 12 U/L (ref 10–30)
Albumin: 4.4 g/dL (ref 3.6–5.1)
Alkaline phosphatase (APISO): 55 U/L (ref 31–125)
BUN: 14 mg/dL (ref 7–25)
CO2: 28 mmol/L (ref 20–32)
Calcium: 9.4 mg/dL (ref 8.6–10.2)
Chloride: 105 mmol/L (ref 98–110)
Creat: 0.76 mg/dL (ref 0.50–0.97)
Globulin: 2.6 g/dL (calc) (ref 1.9–3.7)
Glucose, Bld: 102 mg/dL — ABNORMAL HIGH (ref 65–99)
Potassium: 4 mmol/L (ref 3.5–5.3)
Sodium: 141 mmol/L (ref 135–146)
Total Bilirubin: 0.3 mg/dL (ref 0.2–1.2)
Total Protein: 7 g/dL (ref 6.1–8.1)

## 2021-06-22 NOTE — Addendum Note (Signed)
Addended by: Kandra Nicolas on: 06/22/2021 04:09 PM   Modules accepted: Orders

## 2021-07-19 ENCOUNTER — Encounter: Payer: Self-pay | Admitting: Family Medicine
